# Patient Record
Sex: Female | Born: 1975 | Race: White | Hispanic: No | Marital: Married | Smoking: Former smoker
Health system: Southern US, Community
[De-identification: ages and names within clinical notes are randomized; demographics above are authoritative.]

## PROBLEM LIST (undated history)

## (undated) DIAGNOSIS — G8929 Other chronic pain: Secondary | ICD-10-CM

## (undated) DIAGNOSIS — R51 Headache: Secondary | ICD-10-CM

## (undated) DIAGNOSIS — G35 Multiple sclerosis: Secondary | ICD-10-CM

## (undated) DIAGNOSIS — R519 Headache, unspecified: Secondary | ICD-10-CM

## (undated) DIAGNOSIS — R569 Unspecified convulsions: Secondary | ICD-10-CM

## (undated) HISTORY — PX: COSMETIC SURGERY: SHX468

## (undated) HISTORY — DX: Headache, unspecified: R51.9

## (undated) HISTORY — DX: Headache: R51

## (undated) HISTORY — DX: Other chronic pain: G89.29

## (undated) HISTORY — DX: Unspecified convulsions: R56.9

## (undated) HISTORY — DX: Multiple sclerosis: G35

## (undated) HISTORY — PX: ABDOMINAL SURGERY: SHX537

---

## 2014-06-14 DIAGNOSIS — E538 Deficiency of other specified B group vitamins: Secondary | ICD-10-CM | POA: Insufficient documentation

## 2014-06-14 DIAGNOSIS — G35 Multiple sclerosis: Secondary | ICD-10-CM | POA: Insufficient documentation

## 2014-06-14 DIAGNOSIS — M545 Low back pain, unspecified: Secondary | ICD-10-CM | POA: Insufficient documentation

## 2014-06-14 DIAGNOSIS — G40219 Localization-related (focal) (partial) symptomatic epilepsy and epileptic syndromes with complex partial seizures, intractable, without status epilepticus: Secondary | ICD-10-CM | POA: Insufficient documentation

## 2014-07-15 DIAGNOSIS — R0600 Dyspnea, unspecified: Secondary | ICD-10-CM | POA: Insufficient documentation

## 2014-08-05 ENCOUNTER — Encounter: Payer: Self-pay | Admitting: Neurology

## 2014-08-05 ENCOUNTER — Encounter: Payer: Self-pay | Admitting: *Deleted

## 2014-08-05 ENCOUNTER — Ambulatory Visit (INDEPENDENT_AMBULATORY_CARE_PROVIDER_SITE_OTHER): Payer: 59 | Admitting: Neurology

## 2014-08-05 VITALS — BP 103/71 | HR 91 | Ht 61.5 in | Wt 125.4 lb

## 2014-08-05 DIAGNOSIS — G40209 Localization-related (focal) (partial) symptomatic epilepsy and epileptic syndromes with complex partial seizures, not intractable, without status epilepticus: Secondary | ICD-10-CM | POA: Diagnosis not present

## 2014-08-05 DIAGNOSIS — M5440 Lumbago with sciatica, unspecified side: Secondary | ICD-10-CM

## 2014-08-05 DIAGNOSIS — H532 Diplopia: Secondary | ICD-10-CM

## 2014-08-05 DIAGNOSIS — G35 Multiple sclerosis: Secondary | ICD-10-CM | POA: Diagnosis not present

## 2014-08-05 DIAGNOSIS — R5383 Other fatigue: Secondary | ICD-10-CM

## 2014-08-05 DIAGNOSIS — R260 Ataxic gait: Secondary | ICD-10-CM | POA: Diagnosis not present

## 2014-08-05 DIAGNOSIS — R4189 Other symptoms and signs involving cognitive functions and awareness: Secondary | ICD-10-CM

## 2014-08-05 DIAGNOSIS — E538 Deficiency of other specified B group vitamins: Secondary | ICD-10-CM | POA: Diagnosis not present

## 2014-08-05 MED ORDER — ACETAMINOPHEN-CODEINE #3 300-30 MG PO TABS: 1.0000 | ORAL_TABLET | Freq: Three times a day (TID) | ORAL | Status: DC | PRN

## 2014-08-05 MED ORDER — METHYLPREDNISOLONE 4 MG PO TBPK: ORAL_TABLET | ORAL | Status: DC

## 2014-08-05 NOTE — Patient Instructions (Addendum)
Pt was instructed that she would receive a shot of Vit B12 that was provided by her from her pharmacy. Dr. Epimenio Foot asked me to give this pt a shot of of vit b12 IM injection while she was in the office. I gave the pt a shot with a 25G 1 inch needle into the left deltoid. She provided the medication from home. Colen Darling RN witnessed the injection

## 2014-08-05 NOTE — Progress Notes (Signed)
[facility]  PATIENT: Paula Anderson DOB: Oct 19, 1975  REFERRING DOCTOR OR PCP:  Delbert Harness    Fax [phone] SOURCE: records from Dr. Earnest Bailey, patient, MRI images on CD  She has seen 2 neurologists in [place] before she moved to this area. They are Dr. Salvadore Oxford [phone]) and Georgie Chard.    _________________________________   HISTORICAL  CHIEF COMPLAINT:  Chief Complaint  Patient presents with  . New Evaluation    newly diagnosed with Multiple Sclerosis; pt is in room 5    HISTORY OF PRESENT ILLNESS:  I had the pleasure seeing you patient, Paula Anderson, [facility] for neurologic consultation regarding her multiple sclerosis and seizures. She is a 39 year old woman who presented with difficulties with speech, memory and gait balance in 2013. MRIs were consistent with MS.   In 2014, she had a severe episode of vertigo and was taken to the hospital. Repeat MRI imaging was performed and she was diagnosed officially with MS. She was started on Copaxone. She has episodes where she has more fatigue and pain and has been treated with steroids in the past for these.  Seizures: Around the middle 2014, she began to have spells where she would lose her memory.   Some of the spells occurred while driving and one spell occurred while she was on the stairs and she just remembers waking up at the bottom. She taken to the emergency room for that spell and she had shaking but was conscious during the shaking. Another spell she recalls bending over and then just being awake in the bottom of the bathtub. She is currently on lamotrigine 300 mg twice a day and Vimpat 100 mg by mouth twice a day. She had an EEG in [place] that reportedly had spikes but we do not have those results at this time.  Gait/strength/sensation: She has difficulty with her gait, especially with balance. There also is is sometimes milder weakness in the legs. He has tingling in her legs but no  more pronounced numbness.     Bladder/bowel:  She denies any bladder or bowel issues.  Vision/Vertigo:  In 2013, she lost some vision out of the left eye. She does not note any color asymmetry.  She gets double vision when she looks to the left and her left eye hurts.   She notes vertigo, that may occur at rest but is more likely to occur when she moves. This has been worse over the past several weeks.  Fatigue/sleep:  She has had difficulties with fatigue, physical more than mental. She also has had difficulties with insomnia. She was placed on Ambien. The initial dose was too high and it was reduced. She takes it only occasionally. She also was prescribed a medication, possibly Provigil, to help her stay awake more during the daytime.  Mood/cognition:   She has not noted actual depression but has been irritable at times. This can sometimes come, "out of the blue". She notes some anxiety when there are many people around her, especially when they are to her left since she can't see as well in that direction.  She has noted difficulty with cognition, especially decreased short-term memory, decreased ability to come up with the right word and decreased focus.   B12 deficiency:  She was found to have a B12 deficiency. Initially she was placed on liquids or pills but B12 remained low so shots were prescribed. However, she is unable to give herself shots.  She also reports lower back  pain that is worse now than usual.  I personally reviewed MRI images of the brain dated 06/11/2012.  In the posterior fossa there is a right middle cerebellar peduncle lesion. There are multiple periventricular, deep and juxtacortical white matter foci in the hemispheres. Some of these are oriented to the ventricles. Some are hypointense on T1-weighted images. She also had MRIs from 01/18/2013 on a CD that I was unable to open up.  REVIEW OF SYSTEMS: Constitutional: No fevers, chills, sweats, or change in appetite.   She  reports fatigue and insomnia. Eyes: as above Ear, nose and throat: No hearing loss, ear pain, nasal congestion, sore throat.   She has vertigo. Cardiovascular: No chest pain, palpitations Respiratory: No shortness of breath at rest or with exertion.   No wheezes GastrointestinaI: No nausea, vomiting, diarrhea, abdominal pain, fecal incontinence Genitourinary: No dysuria, urinary retention or frequency.  No nocturia. Musculoskeletal: No neck pain.  She  notesback pain Integumentary: No rash, pruritus, skin lesions Neurological: as above Psychiatric: No depression at this time.  No anxiety Endocrine: No palpitations, diaphoresis, change in appetite, change in weigh or increased thirst Hematologic/Lymphatic: No anemia, purpura, petechiae.   She feels that she bruises easily. Allergic/Immunologic: No itchy/runny eyes, nasal congestion, recent allergic reactions, rashes  ALLERGIES: Allergies  Allergen Reactions  . Amoxicillin Shortness Of Breath  . Penicillins Shortness Of Breath    Skin turns red    HOME MEDICATIONS:  Current outpatient prescriptions:  .  acetaminophen-codeine (TYLENOL #3) 300-30 MG per tablet, Take 1 tablet by mouth every 8 (eight) hours as needed for moderate pain., Disp: 45 tablet, Rfl: 1 .  cholecalciferol (VITAMIN D) 1000 UNITS tablet, Take 1,000 Units by mouth daily., Disp: , Rfl:  .  COPAXONE 40 MG/ML SOSY, , Disp: , Rfl:  .  diclofenac sodium (VOLTAREN) 1 % GEL, Apply topically., Disp: , Rfl:  .  Fluticasone Furoate-Vilanterol (BREO ELLIPTA) 100-25 MCG/INH AEPB, Inhale into the lungs., Disp: , Rfl:  .  lamoTRIgine (LAMICTAL) 100 MG tablet, Take 300 mg by mouth 2 (two) times daily. , Disp: , Rfl: 11 .  methylPREDNISolone (MEDROL DOSEPAK) 4 MG TBPK tablet, Take as directed, Disp: 21 tablet, Rfl: 0 .  VIMPAT 50 MG TABS tablet, Take 100 mg by mouth 2 (two) times daily. , Disp: , Rfl: 5 .  VITAMIN B1-B12 IM, Inject 1,000 mcg into the muscle every 30 (thirty)  days., Disp: , Rfl:   PAST MEDICAL HISTORY: Past Medical History  Diagnosis Date  . Multiple sclerosis   . Chronic headaches   . Seizures     PAST SURGICAL HISTORY: Past Surgical History  Procedure Laterality Date  . Cosmetic surgery    . Abdominal surgery      FAMILY HISTORY: Family History  Problem Relation Age of Onset  . Cancer Father     SOCIAL HISTORY:  History   Social History  . Marital Status: Married    Spouse Name: Richard  . Number of Children: 2  . Years of Education: 12   Occupational History  . unemployeed    Social History Main Topics  . Smoking status: Former Games developer  . Smokeless tobacco: Not on file     Comment: quit 2010  . Alcohol Use: No  . Drug Use: No  . Sexual Activity:    Partners: Male   Other Topics Concern  . Not on file   Social History Narrative   Lives at home with husband and children   Drinks caffeine:  2 cups a day     PHYSICAL EXAM  Filed Vitals:   08/05/14 0937  BP: 103/71  Pulse: 91  Height: 5' 1.5" (1.562 m)  Weight: 125 lb 6.4 oz (56.881 kg)    Body mass index is 23.31 kg/(m^2).   General: The patient is well-developed and well-nourished and in no acute distress  Eyes:  Funduscopic exam shows normal optic discs and retinal vessels.  No cataracts.  VA:   OD 20/50   OS 20/100  Neck: The neck is supple, no carotid bruits are noted.  The neck is nontender.  Cardiovascular: The heart has a regular rate and rhythm with a normal S1 and S2. There were no murmurs, gallops or rubs. Lungs are clear to auscultation.  Skin: Extremities are without significant edema.  Musculoskeletal:  Back is mildly tender  Neurologic Exam  Mental status: The patient is alert and oriented x 3 at the time of the examination. The patient has apparent normal recent and remote memory, with an apparently normal attention span and concentration ability.   Speech is normal.  Cranial nerves: Extraocular movements show left nystagmus on  left gaze. Pupils are equal, round, and reactive to light and accomodation.  Visual fields are full.  Facial symmetry is present. There is good facial sensation to soft touch bilaterally.Facial strength is normal.  Trapezius and sternocleidomastoid strength is normal. No dysarthria is noted.  The tongue is midline, and the patient has symmetric elevation of the soft palate. No obvious hearing deficits are noted.  Motor:  Muscle bulk is normal.   Tone is normal. Strength is  5 / 5 in all 4 extremities.   Sensory: Sensory testing is intact to pinprick, soft touch and vibration sensation in all 4 extremities.  Coordination: Cerebellar testing reveals good finger-nose-finger and heel-to-shin bilaterally.  Gait and station: Station is normal.   Gait is mildly wide. Tandem gait is wide. Romberg is negative.   Reflexes: Deep tendon reflexes are symmetric and increased (spread at knees) bilaterally.   Plantar responses are flexor.    DIAGNOSTIC DATA (LABS, IMAGING, TESTING) - I reviewed patient records, labs, notes, testing and imaging myself where available.     ASSESSMENT AND PLAN  DS (disseminated sclerosis) - Plan: Hepatic function panel, TSH, MR Brain W Wo Contrast, MR Cervical Spine W Wo Contrast, CBC with Differential, CANCELED: CBC with Differential/Platelet  Localization-related focal epilepsy with complex partial seizures  B12 deficiency - Plan: Vitamin B12  Midline low back pain with sciatica, sciatica laterality unspecified  Ataxic gait - Plan: Vitamin B12, MR Brain W Wo Contrast, MR Cervical Spine W Wo Contrast  Diplopia - Plan: TSH, MR Brain W Wo Contrast  Other fatigue - Plan: Vitamin B12, TSH, CBC with Differential  Disturbed cognition    In summary, Jermiah Howton is a 39 year old woman who was diagnosed with  Relapsing remitting MS in 2013 and began Copaxone therapy in 2014. Her main issues are poor gait, left visual disturbance, fatigue and disturbed cognition. She  also has had spells that have been diagnosed as epilepsy and she is currently on lamotrigine plus Vimpat.  I will check an MRI of the brain with and without contrast and MRI and the cervical spine to determine if there has been subclinical progression as she has had more symptoms over the last year. The MRI of the spine can also rule out non-MS related issues that might be contributing to her gait disturbance.    We will get records from  her 2 prior neurologist. She brought her vitamin B12 but does not like giving herself shots so 1 cc was injected while she was here.     We'll check blood work. She also reports that her low back pain is worse.  Tylenol No. 3 was prescribed.   She will return to see me in 3 months or sooner if she has new or worsening neurologic symptoms or based on the findings of the MRI studies.  Richard A. Epimenio Foot, MD, PhD 08/05/2014, 10:34 AM Certified in Neurology, Clinical Neurophysiology, Sleep Medicine, Pain Medicine and Neuroimaging  [facility] [address], [address] [place], [place] [address] [phone]

## 2014-08-06 LAB — TSH: TSH: 0.894 u[IU]/mL (ref 0.450–4.500)

## 2014-08-06 LAB — HEPATIC FUNCTION PANEL
ALK PHOS: 77 IU/L (ref 39–117)
ALT: 10 IU/L (ref 0–32)
AST: 16 IU/L (ref 0–40)
Albumin: 4.7 g/dL (ref 3.5–5.5)
BILIRUBIN, DIRECT: 0.1 mg/dL (ref 0.00–0.40)
Bilirubin Total: 0.4 mg/dL (ref 0.0–1.2)
Total Protein: 7.1 g/dL (ref 6.0–8.5)

## 2014-08-06 LAB — CBC WITH DIFFERENTIAL/PLATELET
Basophils Absolute: 0 10*3/uL (ref 0.0–0.2)
Basos: 1 %
EOS (ABSOLUTE): 0.2 10*3/uL (ref 0.0–0.4)
EOS: 3 %
Hematocrit: 43.3 % (ref 34.0–46.6)
Hemoglobin: 14.2 g/dL (ref 11.1–15.9)
Immature Grans (Abs): 0 10*3/uL (ref 0.0–0.1)
Immature Granulocytes: 0 %
Lymphocytes Absolute: 1.9 10*3/uL (ref 0.7–3.1)
Lymphs: 31 %
MCH: 30.6 pg (ref 26.6–33.0)
MCHC: 32.8 g/dL (ref 31.5–35.7)
MCV: 93 fL (ref 79–97)
Monocytes Absolute: 0.4 10*3/uL (ref 0.1–0.9)
Monocytes: 7 %
NEUTROS PCT: 58 %
Neutrophils Absolute: 3.6 10*3/uL (ref 1.4–7.0)
PLATELETS: 252 10*3/uL (ref 150–379)
RBC: 4.64 x10E6/uL (ref 3.77–5.28)
RDW: 12.8 % (ref 12.3–15.4)
WBC: 6.1 10*3/uL (ref 3.4–10.8)

## 2014-08-06 LAB — VITAMIN B12: Vitamin B-12: >2000 pg/mL — ABNORMAL HIGH (ref 211–946)

## 2014-08-08 ENCOUNTER — Telehealth: Payer: Self-pay | Admitting: *Deleted

## 2014-08-08 NOTE — Telephone Encounter (Signed)
LMTC./fim 

## 2014-08-08 NOTE — Telephone Encounter (Signed)
I have spoken with Paula Anderson and per RAS, have advised labs are ok--she can stop vitamin b12 inj. as vit. b12 level was high.  [name] verbalized understanding of same/fim

## 2014-08-08 NOTE — Telephone Encounter (Signed)
Patient returned call. Please call and advise.  °

## 2014-08-08 NOTE — Telephone Encounter (Signed)
-----   Message from Richard A Sater, MD sent at 08/08/2014 12:15 PM EDT ----- Labs were normal 

## 2014-08-24 ENCOUNTER — Telehealth: Payer: Self-pay | Admitting: *Deleted

## 2014-08-24 NOTE — Telephone Encounter (Signed)
LMTC./fim 

## 2014-08-24 NOTE — Telephone Encounter (Signed)
-----   Message from Asa Lente, MD sent at 08/08/2014 12:15 PM EDT ----- Labs were normal

## 2014-08-25 ENCOUNTER — Encounter: Payer: Self-pay | Admitting: *Deleted

## 2014-08-25 NOTE — Telephone Encounter (Signed)
-----   Message from Richard A Sater, MD sent at 08/08/2014 12:15 PM EDT ----- Labs were normal 

## 2014-08-25 NOTE — Telephone Encounter (Signed)
Unable to contact letter mailed to home address, advising Lavinia that recent labwork done in our office was normal; call with questions/fim

## 2014-09-14 ENCOUNTER — Telehealth: Payer: Self-pay | Admitting: Neurology

## 2014-09-14 NOTE — Telephone Encounter (Signed)
Patient states he has been doing well until 3 days ago.She feels her bones are aching. She has been taking aleve and advil but is questioning how many at a time she can take. She is trying to get some kind of relief. Please call and advise. Patient can be reached at [phone].

## 2014-09-15 MED ORDER — METHYLPREDNISOLONE 4 MG PO TBPK: ORAL_TABLET | ORAL | Status: DC

## 2014-09-15 MED ORDER — MELOXICAM 15 MG PO TABS: 15.0000 mg | ORAL_TABLET | Freq: Every day | ORAL | Status: DC

## 2014-09-15 NOTE — Telephone Encounter (Signed)
I spoke with [name] yesterday--she c/o generalized "achiness".  Sts. in the past oral steroids have helped. Per RAS, ok for Medrol dose pk, and for Meloxicam 15mg , one po qd #30.  I lmom for Angellena letting her know I have escribed these rx's to her pharmacy--she does not have to return this call unless she has questions/fim

## 2014-09-26 ENCOUNTER — Telehealth: Payer: Self-pay | Admitting: Neurology

## 2014-09-26 NOTE — Telephone Encounter (Signed)
Patient is requesting a RX for a ondansetron ODT  for nausea with vertigo. She got this from ED in [place]. P Please call and advise.Please call to [facility] on [address], [address].

## 2014-09-27 MED ORDER — ONDANSETRON HCL 8 MG PO TABS: 8.0000 mg | ORAL_TABLET | Freq: Three times a day (TID) | ORAL | Status: DC | PRN

## 2014-09-27 NOTE — Telephone Encounter (Signed)
I have spoken with Shany this morning and per RAS, have advised that rx. for Zofran 8mg  #12 with 3 r/f has been sent to [facility] per her request.  She verbalized understanding of same/fim

## 2014-09-27 NOTE — Telephone Encounter (Signed)
Ok to send in #12 with 3 refills

## 2014-10-19 ENCOUNTER — Ambulatory Visit (INDEPENDENT_AMBULATORY_CARE_PROVIDER_SITE_OTHER): Payer: 59

## 2014-10-19 DIAGNOSIS — H532 Diplopia: Secondary | ICD-10-CM

## 2014-10-19 DIAGNOSIS — R260 Ataxic gait: Secondary | ICD-10-CM

## 2014-10-19 DIAGNOSIS — G35 Multiple sclerosis: Secondary | ICD-10-CM | POA: Diagnosis not present

## 2014-10-19 MED ORDER — GADOPENTETATE DIMEGLUMINE 469.01 MG/ML IV SOLN: 12.0000 mL | Freq: Once | INTRAVENOUS | Status: DC | PRN

## 2014-10-25 ENCOUNTER — Telehealth: Payer: Self-pay | Admitting: *Deleted

## 2014-10-25 NOTE — Telephone Encounter (Signed)
I have spoken with Paula Anderson this afternoon and per RAS, advised that mri brain did not show any brand new lesions.  She sts. she had an mri in Aug. 2015 in [place]. [place], [place].  She will ask for cd of mri to be mailed to Dr. Bonnita Hollow attn here at [facility]/fim

## 2014-10-25 NOTE — Telephone Encounter (Signed)
-----   Message from Asa Lente, MD sent at 10/22/2014  3:03 PM EDT ----- Please let Paula Anderson know that the MRI of the brain does not show any brand-new lesions. She had an MRI in 2014. If she knows where that was performed we will try to get the old MRI for comparison. The MRI of the cervical spine was normal.

## 2014-10-26 ENCOUNTER — Telehealth: Payer: Self-pay | Admitting: Neurology

## 2014-10-26 NOTE — Telephone Encounter (Signed)
I have spoken with Paula Anderson this morning and advised that she should continue meds as rx'd until her 9-12 appt. with RAS.  She verbalized understanding of same/fim

## 2014-10-26 NOTE — Telephone Encounter (Signed)
Patient is calling to advise that she will get the cd of MRI mailed to Dr. Epimenio Foot. Patient also wants to discuss her seizure medication. She is presently taking lamoTRIgine (LAMICTAL) 100 MG tablet but has discontinued taking VIMPAT 50 MG TABS tablet because medication was not helping with seizures. Is there another seizure medication she can take to replace Vimpat. Please call to [facility] on the corner of [phone] and [address] in [place]. Thank you.

## 2014-10-31 ENCOUNTER — Ambulatory Visit (INDEPENDENT_AMBULATORY_CARE_PROVIDER_SITE_OTHER): Payer: 59 | Admitting: Neurology

## 2014-10-31 ENCOUNTER — Encounter: Payer: Self-pay | Admitting: Neurology

## 2014-10-31 VITALS — BP 106/54 | HR 88 | Resp 14 | Ht 61.5 in | Wt 125.0 lb

## 2014-10-31 DIAGNOSIS — G35 Multiple sclerosis: Secondary | ICD-10-CM | POA: Diagnosis not present

## 2014-10-31 DIAGNOSIS — R260 Ataxic gait: Secondary | ICD-10-CM | POA: Diagnosis not present

## 2014-10-31 DIAGNOSIS — R5383 Other fatigue: Secondary | ICD-10-CM

## 2014-10-31 DIAGNOSIS — R4189 Other symptoms and signs involving cognitive functions and awareness: Secondary | ICD-10-CM | POA: Diagnosis not present

## 2014-10-31 DIAGNOSIS — G40209 Localization-related (focal) (partial) symptomatic epilepsy and epileptic syndromes with complex partial seizures, not intractable, without status epilepticus: Secondary | ICD-10-CM | POA: Diagnosis not present

## 2014-10-31 MED ORDER — MODAFINIL 200 MG PO TABS: 200.0000 mg | ORAL_TABLET | Freq: Every day | ORAL | Status: DC

## 2014-10-31 MED ORDER — DOXEPIN HCL 10 MG PO CAPS: 10.0000 mg | ORAL_CAPSULE | Freq: Every day | ORAL | Status: DC

## 2014-10-31 MED ORDER — ZONISAMIDE 100 MG PO CAPS: 100.0000 mg | ORAL_CAPSULE | Freq: Every day | ORAL | Status: DC

## 2014-10-31 NOTE — Progress Notes (Signed)
Marland Kitchen   [facility]  PATIENT: Paula Anderson DOB: September 25, 1975  REFERRING DOCTOR OR PCP:  Delbert Harness    Fax [phone] SOURCE: records from Dr. Earnest Bailey, patient, MRI images on CD  She has seen 2 neurologists in [place] before she moved to this area. They are Dr. Salvadore Oxford [phone]) and Georgie Chard.    _________________________________   HISTORICAL  CHIEF COMPLAINT:  Chief Complaint  Patient presents with  . Multiple Sclerosis    Sts. she continues to tolerate Copaxone well.  Sts. onset 3 weeks ago of increased coughing and wonders if this is related to MS.  Sts. she stopped Vimpat about a month ago because she felt it wasn't working--she continues to have episodes of decreeased awareness.  Sts. last episode was last week./fim  . Seizures    HISTORY OF PRESENT ILLNESS:  Mackenna Anderson is a 39 yo woman with  multiple sclerosis and seizures.   MS:   She is a 39 year old woman who presented with difficulties with speech, memory and gait balance in 2013. MRIs were consistent with MS.   In 2014, she had a severe episode of vertigo and was taken to the hospital. Repeat MRI imaging was performed and she was diagnosed officially with MS. She was started on Copaxone.   She received IV steroids couple times when she had severe fatigue.  Seizures: Her last seizure was last week.  She has about 2-3 a month, some witnessed by husband.   She has had only one GTC seizure.   Other events are associated with loss of memory and staring with decreased responsiveness. These started in 2014.     Some of the spells occurred while driving and one spell occurred while she was on the stairs and she just remembers waking up at the bottom. She taken to the emergency room for that spell and she had shaking but was conscious during the shaking.   She is currently on lamotrigine 300 mg twice a day.   Addition of Vimpat had not helped.   She had an EEG in [place] that reportedly had spikes but we do not  have those results at this time.  Gait/strength/sensation: She has difficulty with her balance and has stumbling but very rare falls. There also is is mild weakness in the left leg. She has tingling in her legs but no more pronounced numbness.     Bladder/bowel:  She denies any bladder or bowel issues.  Vision/Vertigo:  In 2013, she lost some vision out of the left eye. She notes mild asymmetry with brightness but not colors.     She notes vertigo, that may occur at rest but is more likely to occur when she moves. This has been worse over the past several weeks.   They are associated with nausea and rare vomiting.   They occur mostly at night.  Fatigue/sleep:  She has had difficulties with fatigue, physical more than mental.   She has insomnia. She was placed on Ambien but it had a hangover effect the next day.    She also was prescribed a medication, possibly Provigil, to help her stay awake more during the daytime.  Mood/cognition:   She denies much depression or generalized anxiety.    She notes some anxiety when there are many people around her like at church, especially when they are to her left since she can't see as well in that direction.  She has noted difficulty with cognition, especially decreased short-term memory, decreased ability to  come up with the right word and decreased focus.   B12 deficiency:  She was found to have a B12 deficiency. Initially she was placed on liquids or pills but B12 remained low so shots were prescribed. However, she is unable to give herself shots.  I personally reviewed MRI images of the brain dated 06/11/2012.  In the posterior fossa there is a right middle cerebellar peduncle lesion. There are multiple periventricular, deep and juxtacortical white matter foci in the hemispheres. Some of these are oriented to the ventricles. Some are hypointense on T1-weighted images. She also had MRIs from 01/18/2013 on a CD that I was unable to open up.  REVIEW OF  SYSTEMS: Constitutional: No fevers, chills, sweats, or change in appetite.   She reports fatigue and insomnia. Eyes: as above Ear, nose and throat: No hearing loss, ear pain, nasal congestion, sore throat.   She has vertigo. Cardiovascular: No chest pain, palpitations Respiratory: No shortness of breath at rest or with exertion.   No wheezes GastrointestinaI: No nausea, vomiting, diarrhea, abdominal pain, fecal incontinence Genitourinary: No dysuria, urinary retention or frequency.  No nocturia. Musculoskeletal: No neck pain.  She  notesback pain Integumentary: No rash, pruritus, skin lesions Neurological: as above Psychiatric: No depression at this time.  No anxiety Endocrine: No palpitations, diaphoresis, change in appetite, change in weigh or increased thirst Hematologic/Lymphatic: No anemia, purpura, petechiae.   She feels that she bruises easily. Allergic/Immunologic: No itchy/runny eyes, nasal congestion, recent allergic reactions, rashes  ALLERGIES: Allergies  Allergen Reactions  . Amoxicillin Shortness Of Breath  . Penicillins Shortness Of Breath    Skin turns red    HOME MEDICATIONS:  Current outpatient prescriptions:  .  cholecalciferol (VITAMIN D) 1000 UNITS tablet, Take 1,000 Units by mouth daily., Disp: , Rfl:  .  COPAXONE 40 MG/ML SOSY, , Disp: , Rfl:  .  Fluticasone Furoate-Vilanterol (BREO ELLIPTA) 100-25 MCG/INH AEPB, Inhale into the lungs., Disp: , Rfl:  .  lamoTRIgine (LAMICTAL) 100 MG tablet, Take 300 mg by mouth 2 (two) times daily. , Disp: , Rfl: 11 .  acetaminophen-codeine (TYLENOL #3) 300-30 MG per tablet, Take 1 tablet by mouth every 8 (eight) hours as needed for moderate pain. (Patient not taking: Reported on 10/31/2014), Disp: 45 tablet, Rfl: 1 .  diclofenac sodium (VOLTAREN) 1 % GEL, Apply topically., Disp: , Rfl:  .  meloxicam (MOBIC) 15 MG tablet, Take 1 tablet (15 mg total) by mouth daily. (Patient not taking: Reported on 10/31/2014), Disp: 30 tablet,  Rfl: 0 .  methylPREDNISolone (MEDROL DOSEPAK) 4 MG TBPK tablet, Take as directed (Patient not taking: Reported on 10/31/2014), Disp: 21 tablet, Rfl: 0 .  ondansetron (ZOFRAN) 8 MG tablet, Take 1 tablet (8 mg total) by mouth every 8 (eight) hours as needed for nausea or vomiting. (Patient not taking: Reported on 10/31/2014), Disp: 12 tablet, Rfl: 3 .  VIMPAT 50 MG TABS tablet, Take 100 mg by mouth 2 (two) times daily. , Disp: , Rfl: 5 .  VITAMIN B1-B12 IM, Inject 1,000 mcg into the muscle every 30 (thirty) days., Disp: , Rfl:  No current facility-administered medications for this visit.  Facility-Administered Medications Ordered in Other Visits:  .  gadopentetate dimeglumine (MAGNEVIST) injection 12 mL, 12 mL, Intravenous, Once PRN, Asa Lente, MD  PAST MEDICAL HISTORY: Past Medical History  Diagnosis Date  . Multiple sclerosis   . Chronic headaches   . Seizures     PAST SURGICAL HISTORY: Past Surgical History  Procedure Laterality  Date  . Cosmetic surgery    . Abdominal surgery      FAMILY HISTORY: Family History  Problem Relation Age of Onset  . Cancer Father     SOCIAL HISTORY:  Social History   Social History  . Marital Status: Married    Spouse Name: Richard  . Number of Children: 2  . Years of Education: 12   Occupational History  . unemployeed    Social History Main Topics  . Smoking status: Former Games developer  . Smokeless tobacco: Not on file     Comment: quit 2010  . Alcohol Use: No  . Drug Use: No  . Sexual Activity:    Partners: Male   Other Topics Concern  . Not on file   Social History Narrative   Lives at home with husband and children   Drinks caffeine: 2 cups a day     PHYSICAL EXAM  Filed Vitals:   10/31/14 0956  BP: 106/54  Pulse: 88  Resp: 14  Height: 5' 1.5" (1.562 m)  Weight: 125 lb (56.7 kg)    Body mass index is 23.24 kg/(m^2).   General: The patient is well-developed and well-nourished and in no acute distress    Neurologic Exam  Mental status: The patient is alert and oriented x 3 at the time of the examination. The patient has apparent normal recent and remote memory, with an apparently normal attention span and concentration ability.   Speech is normal.  Cranial nerves: Extraocular movements show left nystagmus on left gaze.  Facial symmetry is present. There is good facial sensation to soft touch bilaterally.Facial strength is normal.  Trapezius and sternocleidomastoid strength is normal. No dysarthria is noted.  The tongue is midline, and the patient has symmetric elevation of the soft palate. No obvious hearing deficits are noted.  Motor:  Muscle bulk is normal.   Tone is normal. Strength is  5 / 5 in all 4 extremities.   Sensory: Sensory testing is intact to pinprick, soft touch and vibration sensation in all 4 extremities.  Coordination: Cerebellar testing reveals good finger-nose-finger and heel-to-shin bilaterally.  Gait and station: Station is normal.   Gait is mildly wide. Tandem gait is wide. Romberg is negative.   Reflexes: Deep tendon reflexes are symmetric and increased (spread at knees) bilaterally.      DIAGNOSTIC DATA (LABS, IMAGING, TESTING) - I reviewed patient records, labs, notes, testing and imaging myself where available.     ASSESSMENT AND PLAN  DS (disseminated sclerosis)  Other fatigue  Ataxic gait  Disturbed cognition  Localization-related focal epilepsy with complex partial seizures   1.   Add zonisamide for seizures 2.   Doxepin for insomnia at night.   She will try modafinil fro her sleepiness and fatigue 3.   She will return to see me in 4 months or sooner if she has new or worsening neurologic symptoms     Richard A. Epimenio Foot, MD, PhD 10/31/2014, 10:25 AM Certified in Neurology, Clinical Neurophysiology, Sleep Medicine, Pain Medicine and Neuroimaging  [facility] [address], [address] [place], [place] [address] [phone]

## 2014-11-11 ENCOUNTER — Telehealth: Payer: Self-pay | Admitting: Neurology

## 2014-11-11 NOTE — Telephone Encounter (Signed)
I have spoken with Tammy Sours at [facility].  He sts. Modafinil needs a pa.  I have requested pa form be faxed to our office.  I have spoken with Brunilda and advised that pa form will be faxed to our office, but that it will likely not be completed today, as our office is currently closed, but that I will complete pa form asap next week and fax it back to insurance.  She verbalized understanding of same/fim

## 2014-11-11 NOTE — Telephone Encounter (Signed)
Patient is calling to discuss a medication she is taking for anxiety but does not know the name of it. [facility] says they need pre authorization for this medication. Please call and discuss.

## 2014-11-15 NOTE — Telephone Encounter (Signed)
I have spoken with Paula Anderson and given update that I have not received pa form from [facility]/fim

## 2014-11-17 ENCOUNTER — Encounter: Payer: Self-pay | Admitting: *Deleted

## 2014-11-17 NOTE — Telephone Encounter (Signed)
I still have not received pa form from [facility].  I have spoken with the pharmacist again today and requested form be faxed to [phone].  I have spoken with Raena to advise I am still waiting on this/fim

## 2014-11-17 NOTE — Telephone Encounter (Signed)
I received pa form from [facility].  I have spoken with Precious at [facility], given pa info, and have been informed that Modafinil is approved thru 11-17-15.  Ref. # [id number].  LMOM for Larae that this has been completed.  she does not need to return this call unless she has further questions/fim

## 2014-11-17 NOTE — Telephone Encounter (Addendum)
Pt called and would like an update on the situation with [facility]. Please all and advise [phone]

## 2014-12-05 ENCOUNTER — Telehealth: Payer: Self-pay | Admitting: Neurology

## 2014-12-05 NOTE — Telephone Encounter (Signed)
I have spoken with Paula Anderson, and per RAS, advised that b/c vaccines can fail, she should not be around anyone with a communicable disease.  She verbalized understanding of same, sts. she is not utd with vaccines anyway, and will f/u with her pcp for this/fim

## 2014-12-05 NOTE — Telephone Encounter (Signed)
Pt called and is wondering if it is ok to be around someone , mother in law, who has been around the sister in law who actually has Pertusis. Pt is flying out this afternoon and with MS she is concerned, does she need to stay away from them? Please call and advise [phone].

## 2014-12-14 ENCOUNTER — Telehealth: Payer: Self-pay | Admitting: Neurology

## 2014-12-14 MED ORDER — AMPHETAMINE-DEXTROAMPHET ER 20 MG PO CP24: 20.0000 mg | ORAL_CAPSULE | Freq: Every day | ORAL | Status: DC

## 2014-12-14 NOTE — Telephone Encounter (Signed)
Patient called to advise modafinil (PROVIGIL) 200 MG tablet medication that was given to her to stay awake, is not working. Patient states, she has finished taking the whole bottle and is not working.

## 2014-12-14 NOTE — Telephone Encounter (Signed)
LMTC./fim 

## 2014-12-14 NOTE — Telephone Encounter (Signed)
I have spoken with Arnell again and per RAS, offered Adderall XR 20mg  po qd.  She is agreeable, is aware ins. may require a pa, but will not know this until she takes rx. to the pharmacy.  Rx. printed, signed, up front [facility]/fim

## 2014-12-14 NOTE — Telephone Encounter (Signed)
I have spoken with Paula Anderson this afternoon.  She reports Provigil is not helping at all with fatigue. Sts. she has never tried any other meds for fatigue/fim

## 2014-12-14 NOTE — Telephone Encounter (Signed)
Pt called returning Faith's call

## 2014-12-20 DIAGNOSIS — Z8541 Personal history of malignant neoplasm of cervix uteri: Secondary | ICD-10-CM | POA: Insufficient documentation

## 2014-12-20 MED ORDER — AMPHETAMINE-DEXTROAMPHET ER 30 MG PO CP24: 30.0000 mg | ORAL_CAPSULE | Freq: Every day | ORAL | Status: DC

## 2014-12-20 NOTE — Addendum Note (Signed)
Addended by: Candis Schatz I on: 12/20/2014 05:02 PM   Modules accepted: Orders

## 2014-12-20 NOTE — Telephone Encounter (Signed)
LMTC. Per RAS, ok to increase to Adderall XR  daily.  Rx. printed, signed, up front [facility]/fim

## 2014-12-20 NOTE — Telephone Encounter (Signed)
Pt called sts she has taken Adderall for a couple of days and it does seem to be working but after 3-4 hrs it wears off and she goes back to bed. She doesn't know if she is taking it too early in the morning. Please call and advise at [phone]

## 2014-12-21 NOTE — Telephone Encounter (Signed)
Patient is calling to discuss Rx amphetamine-dextroamphetamine (ADDERALL XR) 30 MG 24 hr capsule before picking up the Rx. Thank you.

## 2014-12-21 NOTE — Telephone Encounter (Signed)
I have spoken with Paula Anderson this am and per RAS, advised ok to increase to Adderall XR 30mg  daily to see if this gives longer lasting relief from fatigue.  She verbalized understanding of same, sts. will pick rx. up tomorrow./fim

## 2014-12-22 NOTE — Telephone Encounter (Signed)
Error

## 2015-02-06 ENCOUNTER — Telehealth: Payer: Self-pay | Admitting: Neurology

## 2015-02-06 MED ORDER — AMPHETAMINE-DEXTROAMPHET ER 30 MG PO CP24: 30.0000 mg | ORAL_CAPSULE | Freq: Every day | ORAL | Status: DC

## 2015-02-06 NOTE — Telephone Encounter (Signed)
Patient called to request renewal of amphetamine-dextroamphetamine (ADDERALL XR) 30 MG 24 hr capsule, prescription has expired. States she has 1st set of 20 mg and doesn't know if she could take 2 of those since she has been out since Friday.

## 2015-02-06 NOTE — Telephone Encounter (Signed)
Adderall rx. printed, signed, up front [facility].  I spoke with [name] and let her know rx. is available to be picked up/fim

## 2015-02-22 ENCOUNTER — Telehealth: Payer: Self-pay | Admitting: Neurology

## 2015-02-22 NOTE — Telephone Encounter (Signed)
Pt called and says she woke up yesterday morning and says she is having numbness in pelvic region. Says that during intercourse she does not have any feeling. She also says that her whole body itches. Please call and advise [phone].

## 2015-02-22 NOTE — Telephone Encounter (Signed)
I have spoken with Paula Anderson and per RAS, advised that numbness in the pelvic region alone is less likely to be related to MS.  She has no other c/o involving legs.  No trunkal numbness.  Per RAS, is sx. worsens or does not improve, he would do an mri thoracic spine with and without contrast.  [name] verbalized understanding of same--is aware to f/u with ob/gyn for investigation of pelvic numbness, but that she should also call me back if numbness persists or worsens, or if she develops other sx., and mri t-spine will be ordered/fim

## 2015-04-03 ENCOUNTER — Encounter: Payer: Self-pay | Admitting: Neurology

## 2015-04-03 ENCOUNTER — Ambulatory Visit (INDEPENDENT_AMBULATORY_CARE_PROVIDER_SITE_OTHER): Payer: 59 | Admitting: Neurology

## 2015-04-03 VITALS — BP 108/60 | HR 82 | Resp 16 | Ht 61.5 in | Wt 111.0 lb

## 2015-04-03 DIAGNOSIS — G40209 Localization-related (focal) (partial) symptomatic epilepsy and epileptic syndromes with complex partial seizures, not intractable, without status epilepticus: Secondary | ICD-10-CM

## 2015-04-03 DIAGNOSIS — R5383 Other fatigue: Secondary | ICD-10-CM

## 2015-04-03 DIAGNOSIS — R4189 Other symptoms and signs involving cognitive functions and awareness: Secondary | ICD-10-CM | POA: Diagnosis not present

## 2015-04-03 DIAGNOSIS — R260 Ataxic gait: Secondary | ICD-10-CM | POA: Diagnosis not present

## 2015-04-03 DIAGNOSIS — G35 Multiple sclerosis: Secondary | ICD-10-CM

## 2015-04-03 MED ORDER — LEVETIRACETAM 500 MG PO TABS: 500.0000 mg | ORAL_TABLET | Freq: Two times a day (BID) | ORAL | Status: DC

## 2015-04-03 MED ORDER — AMPHETAMINE-DEXTROAMPHET ER 30 MG PO CP24: 30.0000 mg | ORAL_CAPSULE | Freq: Every day | ORAL | Status: DC

## 2015-04-03 NOTE — Progress Notes (Signed)
Marland Kitchen   [facility]  PATIENT: Paula Anderson DOB: 05-30-1975  REFERRING DOCTOR OR PCP:  Delbert Harness    Fax [phone] SOURCE: records from Dr. Earnest Bailey, patient, MRI images on CD  She has seen 2 neurologists in [place] before she moved to this area. They are Dr. Salvadore Oxford [phone]) and Georgie Chard.    _________________________________   HISTORICAL  CHIEF COMPLAINT:  Chief Complaint  Patient presents with  . Multiple Sclerosis    Sts. she continues to tolerate Copaxone well.  Sts. she has had 2 new episodes of decreased awareness--Sts. she was in the grocery store with her husband and he told her she just had  funny look in her eyes, was puting items in the cart that she wouldn't normally buy.  Denies missed doses of Zonisamide or Lamictal.  Today she would like to discuss new sx. of decreased appetite--onset 2 weeks after starting Adderall.  Sts. food smells really bother her./fim    HISTORY OF PRESENT ILLNESS:  Paula Anderson is a 40 yo woman with  multiple sclerosis and seizures.   MS:   She is a 40 year old woman who presented with difficulties with speech, memory and gait balance in 2013. MRIs were consistent with MS.   In 2014, she had a severe episode of vertigo and was taken to the hospital. Repeat MRI imaging was performed and she was diagnosed officially with MS. She was started on Copaxone.   She received IV steroids couple times when she had severe fatigue.       Seizures: She has had a couple more spells since the last visit --- both the same day.    She gets a glazed over look and is briefly unresponsive (20-40 seconds).  She was having 2-3 a month before zonisamide was added but now is doing better.     She is also on lamotrigine 600 mg / day.  She has had only one GTC seizure.   Other events are associated with loss of memory and staring with decreased responsiveness.   Vimpat did not help.      She had an EEG in [place] that reportedly had spikes but we do  not have those results at this time.  Gait/strength/sensation: She has noted a few more falls.   She stumbles and may fall if she can't quickly grab on to something .   She notes mild L> R leg weakness. She has tingling in her legs but no more pronounced numbness.     Bladder/bowel:  She denies any bladder or bowel issues.  Vision/Vertigo:  In 2013, she lost some vision out of the left eye and did not recover much.. She notes mild asymmetry with brightness.     Vertigo is better.   She also had nausea, helped by Zofran.    Fatigue/sleep:  She has had difficulties with fatigue, physical more than mental.  Adderall has helped but has caused decreased appetite.    She has insomnia. She was placed on Ambien but it had a hangover effect the next day.    She also was prescribed a medication, possibly Provigil, to help her stay awake more during the daytime.  Mood/cognition:   She denies depression or generalized anxiety but feels stressed (buying a house).      She has noted difficulty with cognition, especially decreased short-term memory, decreased ability to come up with the right word and decreased focus.   I personally reviewed MRI images of the brain and spine.  In the posterior fossa there is a right middle cerebellar peduncle lesion, possible medulla focus and left midbrain focus and two small cerebellar hemisphere foci. There are multiple periventricular, deep and juxtacortical white matter foci in the hemispheres. Some of these are oriented to the ventricles. Some are hypointense on T1-weighted images. None enhance.  C- Spine without plaques.    REVIEW OF SYSTEMS: Constitutional: No fevers, chills, sweats, or change in appetite.   She reports fatigue and insomnia. Eyes: as above Ear, nose and throat: No hearing loss, ear pain, nasal congestion, sore throat.   She has vertigo. Cardiovascular: No chest pain, palpitations Respiratory: No shortness of breath at rest or with exertion.   No  wheezes GastrointestinaI: No nausea, vomiting, diarrhea, abdominal pain, fecal incontinence Genitourinary: No dysuria, urinary retention or frequency.  No nocturia. Musculoskeletal: No neck pain.  She  notesback pain Integumentary: No rash, pruritus, skin lesions Neurological: as above Psychiatric: No depression at this time.  No anxiety Endocrine: No palpitations, diaphoresis, change in appetite, change in weigh or increased thirst Hematologic/Lymphatic: No anemia, purpura, petechiae.   She feels that she bruises easily. Allergic/Immunologic: No itchy/runny eyes, nasal congestion, recent allergic reactions, rashes  ALLERGIES: Allergies  Allergen Reactions  . Amoxicillin Shortness Of Breath  . Penicillins Shortness Of Breath    Skin turns red    HOME MEDICATIONS:  Current outpatient prescriptions:  .  acetaminophen-codeine (TYLENOL #3) 300-30 MG per tablet, Take 1 tablet by mouth every 8 (eight) hours as needed for moderate pain. (Patient not taking: Reported on 10/31/2014), Disp: 45 tablet, Rfl: 1 .  amphetamine-dextroamphetamine (ADDERALL XR) 30 MG 24 hr capsule, Take 1 capsule (30 mg total) by mouth daily., Disp: 30 capsule, Rfl: 0 .  cholecalciferol (VITAMIN D) 1000 UNITS tablet, Take 1,000 Units by mouth daily., Disp: , Rfl:  .  COPAXONE 40 MG/ML SOSY, , Disp: , Rfl:  .  diclofenac sodium (VOLTAREN) 1 % GEL, Apply topically., Disp: , Rfl:  .  doxepin (SINEQUAN) 10 MG capsule, Take 1 capsule (10 mg total) by mouth at bedtime., Disp: 30 capsule, Rfl: 5 .  Fluticasone Furoate-Vilanterol (BREO ELLIPTA) 100-25 MCG/INH AEPB, Inhale into the lungs., Disp: , Rfl:  .  lamoTRIgine (LAMICTAL) 100 MG tablet, Take 300 mg by mouth 2 (two) times daily. , Disp: , Rfl: 11 .  meloxicam (MOBIC) 15 MG tablet, Take 1 tablet (15 mg total) by mouth daily. (Patient not taking: Reported on 10/31/2014), Disp: 30 tablet, Rfl: 0 .  methylPREDNISolone (MEDROL DOSEPAK) 4 MG TBPK tablet, Take as directed  (Patient not taking: Reported on 10/31/2014), Disp: 21 tablet, Rfl: 0 .  ondansetron (ZOFRAN) 8 MG tablet, Take 1 tablet (8 mg total) by mouth every 8 (eight) hours as needed for nausea or vomiting. (Patient not taking: Reported on 10/31/2014), Disp: 12 tablet, Rfl: 3 .  VITAMIN B1-B12 IM, Inject 1,000 mcg into the muscle every 30 (thirty) days., Disp: , Rfl:  .  zonisamide (ZONEGRAN) 100 MG capsule, Take 1 capsule (100 mg total) by mouth daily., Disp: 30 capsule, Rfl: 11 No current facility-administered medications for this visit.  Facility-Administered Medications Ordered in Other Visits:  .  gadopentetate dimeglumine (MAGNEVIST) injection 12 mL, 12 mL, Intravenous, Once PRN, Asa Lente, MD  PAST MEDICAL HISTORY: Past Medical History  Diagnosis Date  . Multiple sclerosis (HCC)   . Chronic headaches   . Seizures (HCC)     PAST SURGICAL HISTORY: Past Surgical History  Procedure Laterality Date  . Cosmetic  surgery    . Abdominal surgery      FAMILY HISTORY: Family History  Problem Relation Age of Onset  . Cancer Father     SOCIAL HISTORY:  Social History   Social History  . Marital Status: Married    Spouse Name: Richard  . Number of Children: 2  . Years of Education: 12   Occupational History  . unemployeed    Social History Main Topics  . Smoking status: Former Games developer  . Smokeless tobacco: Not on file     Comment: quit 2010  . Alcohol Use: No  . Drug Use: No  . Sexual Activity:    Partners: Male   Other Topics Concern  . Not on file   Social History Narrative   Lives at home with husband and children   Drinks caffeine: 2 cups a day     PHYSICAL EXAM  Filed Vitals:   04/03/15 1307  BP: 108/60  Pulse: 82  Resp: 16  Height: 5' 1.5" (1.562 m)  Weight: 111 lb (50.349 kg)    Body mass index is 20.64 kg/(m^2).   General: The patient is well-developed and well-nourished and in no acute distress   Neurologic Exam  Mental status: The patient is  alert and oriented x 3 at the time of the examination. The patient has apparent normal recent and remote memory, with an apparently normal attention span and concentration ability.   Speech is normal.  Cranial nerves: Extraocular movements show left nystagmus on left gaze.  Facial symmetry is present. There is good facial sensation to soft touch bilaterally.Facial strength is normal.  Trapezius and sternocleidomastoid strength is normal. No dysarthria is noted.  The tongue is midline, and the patient has symmetric elevation of the soft palate. No obvious hearing deficits are noted.  Motor:  Muscle bulk is normal.   Tone is normal. Strength is  5 / 5 in all 4 extremities.   Sensory: Sensory testing is intact to pinprick, soft touch and vibration sensation in all 4 extremities.  Coordination: Cerebellar testing reveals good finger-nose-finger and heel-to-shin bilaterally.  Gait and station: Station is normal.   Gait is mildly wide. Tandem gait is wide. Romberg is negative.   Reflexes: Deep tendon reflexes are symmetric and increased (spread at knees) bilaterally.      DIAGNOSTIC DATA (LABS, IMAGING, TESTING) - I reviewed patient records, labs, notes, testing and imaging myself where available.     ASSESSMENT AND PLAN  Multiple sclerosis (HCC)  Partial epilepsy with impairment of consciousness (HCC)  Ataxic gait  Other fatigue  Disturbed cognition   1.   Change zonisamide to Keppra for seizures as lens and appetite may be related to the zonisamide.  2.   continue Adderall for sleepiness and fatigue 3.    continue to stay active and exercises as tolerated.  4.   She will return to see me in 4 months or sooner if she has new or worsening neurologic symptoms     Richard A. Epimenio Foot, MD, PhD 04/03/2015, 1:30 PM Certified in Neurology, Clinical Neurophysiology, Sleep Medicine, Pain Medicine and Neuroimaging  [facility] [address], [address] [place], [place]  [address] [phone] f r and

## 2015-04-14 ENCOUNTER — Telehealth: Payer: Self-pay | Admitting: Neurology

## 2015-04-14 DIAGNOSIS — G35 Multiple sclerosis: Secondary | ICD-10-CM

## 2015-04-14 DIAGNOSIS — R260 Ataxic gait: Secondary | ICD-10-CM

## 2015-04-14 NOTE — Telephone Encounter (Signed)
Pt called said last OV Dr Epimenio Foot mentioned she should use a cane for balance. Pt sts she fell 2 x yesterday and need a RX for can.

## 2015-04-14 NOTE — Telephone Encounter (Signed)
I have spoken with Paula Anderson and per her request, dme order for can mailed to her home address/fim

## 2015-04-27 ENCOUNTER — Telehealth: Payer: Self-pay | Admitting: Neurology

## 2015-04-27 NOTE — Telephone Encounter (Signed)
[employer] - they are requesting the DME code for cane. They were only provided 4 #'s and it is a 5 digit code. May call customer service at [phone]. Please let the [facility] advocate know you want the conversation time stamped so it is documented for the pt.

## 2015-04-27 NOTE — Telephone Encounter (Signed)
[place]/[employer] called back to advise, call back number given is not a good number.

## 2015-04-27 NOTE — Telephone Encounter (Signed)
[facility] [phone] called regarding DME for cane, needs additional information, what type of equipment, needs code.

## 2015-05-08 ENCOUNTER — Telehealth: Payer: Self-pay | Admitting: Neurology

## 2015-05-08 MED ORDER — AMPHETAMINE-DEXTROAMPHET ER 30 MG PO CP24: 30.0000 mg | ORAL_CAPSULE | Freq: Every day | ORAL | Status: DC

## 2015-05-08 NOTE — Telephone Encounter (Signed)
Patient called to request refill of amphetamine-dextroamphetamine (ADDERALL XR) 30 MG 24 hr capsule °

## 2015-05-08 NOTE — Telephone Encounter (Signed)
Adderall rx. up front [facility]/fim 

## 2015-05-08 NOTE — Telephone Encounter (Signed)
Rx. awaiting RAS sig/fim 

## 2015-05-23 ENCOUNTER — Telehealth: Payer: Self-pay | Admitting: Neurology

## 2015-05-23 NOTE — Telephone Encounter (Signed)
LMOM that we will send Copaxone rx. in.  She does not need to return this call unless she has questions/fim

## 2015-05-23 NOTE — Telephone Encounter (Signed)
Patient is calling and states her PCP has asked if Dr. Epimenio Foot can sent a script for Rx copaxone 40 MG/ML SOSY to [facility] [phone]. She says that Dr. Epimenio Foot has offered to order for her before. Thanks!

## 2015-05-24 DIAGNOSIS — R37 Sexual dysfunction, unspecified: Secondary | ICD-10-CM | POA: Insufficient documentation

## 2015-05-24 MED ORDER — COPAXONE 40 MG/ML ~~LOC~~ SOSY: 40.0000 mg | PREFILLED_SYRINGE | SUBCUTANEOUS | Status: DC

## 2015-05-24 NOTE — Telephone Encounter (Signed)
Copacone rx. esccribed to [facility]/fim

## 2015-06-12 ENCOUNTER — Telehealth: Payer: Self-pay | Admitting: Neurology

## 2015-06-12 MED ORDER — AMPHETAMINE-DEXTROAMPHET ER 30 MG PO CP24: 30.0000 mg | ORAL_CAPSULE | Freq: Every day | ORAL | Status: DC

## 2015-06-12 NOTE — Telephone Encounter (Signed)
Rx. awaiting RAS sig/fim 

## 2015-06-12 NOTE — Telephone Encounter (Signed)
Adderall rx. up front [facility]/fim 

## 2015-06-12 NOTE — Telephone Encounter (Signed)
Pt called request refill for amphetamine-dextroamphetamine (ADDERALL XR) 30 MG 24 hr capsule .

## 2015-06-19 ENCOUNTER — Telehealth: Payer: Self-pay | Admitting: Neurology

## 2015-06-19 NOTE — Telephone Encounter (Signed)
I have spoken with Paula Anderson this afternoon--she c/o increased weakness in legs, more trouble with word finding.  Appt. to see RAS given for tomorrow 1400/fim

## 2015-06-19 NOTE — Telephone Encounter (Signed)
Patient called to advise, she has been doing really good with no seizures, has had headache since Friday morning 4/28, has no vision when she looks left, has had this before in the past but this time it's worse. Legs keep going out on her. Sometimes speaking the wrong word comes out or she can't think of word to say, states this has been going on a long time. Denies facial numbness.

## 2015-06-20 ENCOUNTER — Ambulatory Visit (INDEPENDENT_AMBULATORY_CARE_PROVIDER_SITE_OTHER): Payer: 59 | Admitting: Neurology

## 2015-06-20 ENCOUNTER — Encounter: Payer: Self-pay | Admitting: Neurology

## 2015-06-20 VITALS — BP 108/56 | HR 94 | Resp 14 | Ht 75.0 in | Wt 108.0 lb

## 2015-06-20 DIAGNOSIS — G4489 Other headache syndrome: Secondary | ICD-10-CM | POA: Diagnosis not present

## 2015-06-20 DIAGNOSIS — M542 Cervicalgia: Secondary | ICD-10-CM | POA: Insufficient documentation

## 2015-06-20 DIAGNOSIS — R5383 Other fatigue: Secondary | ICD-10-CM

## 2015-06-20 DIAGNOSIS — G40209 Localization-related (focal) (partial) symptomatic epilepsy and epileptic syndromes with complex partial seizures, not intractable, without status epilepticus: Secondary | ICD-10-CM | POA: Diagnosis not present

## 2015-06-20 DIAGNOSIS — R4189 Other symptoms and signs involving cognitive functions and awareness: Secondary | ICD-10-CM

## 2015-06-20 DIAGNOSIS — G35 Multiple sclerosis: Secondary | ICD-10-CM

## 2015-06-20 DIAGNOSIS — G35D Multiple sclerosis, unspecified: Secondary | ICD-10-CM

## 2015-06-20 DIAGNOSIS — R260 Ataxic gait: Secondary | ICD-10-CM

## 2015-06-20 DIAGNOSIS — H532 Diplopia: Secondary | ICD-10-CM

## 2015-06-20 MED ORDER — AMPHETAMINE-DEXTROAMPHET ER 30 MG PO CP24: 30.0000 mg | ORAL_CAPSULE | Freq: Every day | ORAL | Status: DC

## 2015-06-20 MED ORDER — PREDNISONE 50 MG PO TABS: ORAL_TABLET | ORAL | Status: DC

## 2015-06-20 NOTE — Progress Notes (Addendum)
Marland Kitchen   [facility]  PATIENT: Paula Anderson DOB: 05-Mar-1975  REFERRING DOCTOR OR PCP:  Paula Anderson    Fax [phone] SOURCE: records from Dr. Earnest Anderson, patient, MRI images on CD  She has seen 2 neurologists in [place] before she moved to this area. They are Dr. Salvadore Anderson [phone]) and Paula Anderson.    _________________________________   HISTORICAL  CHIEF COMPLAINT:  Chief Complaint  Patient presents with  . Multiple Sclerosis    Sts. she continues to tolerate Copaxone well.  She c/o more weakness in legs, right worse than left, and more trouble with word finding over the last 5 days/fim    HISTORY OF PRESENT ILLNESS:  Paula Anderson is a 40 yo woman with  multiple sclerosis and seizures. Since her last visit, she reports difficulties with vision on the left medulla headache on the right. She also feels balance is worse.  MS:   She presented with difficulties with speech, memory and gait balance in 2013. MRIs were consistent with MS.   In 2014, she had a severe episode of vertigo and was taken to the hospital. Repeat MRI imaging was performed and she was diagnosed officially with MS. She was started on Copaxone.   She received IV steroids couple times when she had severe fatigue.     She tolerates Copaxone well.    No difficulty with skin reactions.    Vision:    She has worse vision problems on the left with a foggy vision and reduced color saturation.     In 2013, she lost some vision out of the left eye. This seems different to her She notes mild asymmetry with brightness.     Vertigo is mild.   She also had nausea, helped by Zofran.    HA:   For the past week, she has had a headache on the right side that appears to radiate from the occiput forward. She has not noted much pain in the neck itself. She has nausea and also has had photophobia and phonophobia. Movements did not change the pain much.  Seizures: She has had no seizures since zonisamide switched to Keppra  and she tolerates it well.  She remains on lamotrigine.       Last one was 3 months ago with transient LOC.    She gets a glazed over look and is briefly unresponsive (20-40 seconds).     Vimpat did not help.      She had an EEG in [place] that reportedly had spikes but we do not have those results at this time.  Gait/strength/sensation: She feels off balanced, worse the past week.   Her legs were giving out a lot.   She stumbles and has needed to grab on to something for balance .   She notes mild leg weakness, right leg sometimes shakes. She has tingling in her legs but no more pronounced numbness.     Bladder/bowel:  She notes needing to urinate x 10 most days and x 1 most nights.   This is more than last year.        Fatigue/sleep:  She has had difficulties with fatigue, physical more than mental.  Adderall has helped but has caused decreased appetite.    She has insomnia. She was placed on Ambien but it had a hangover effect the next day.    She also was prescribed a medication, possibly Provigil, to help her stay awake more during the daytime.  Mood/cognition:   She  denies depression or generalized anxiety but feels stressed (buying a house).      She has noted difficulty with cognition, especially decreased short-term memory, decreased ability to come up with the right word and decreased focus.   I personally reviewed MRI images of the brain and spine.   In the posterior fossa there is a right middle cerebellar peduncle lesion, possible medulla focus and left midbrain focus and two small cerebellar hemisphere foci. She has thalamic foci.  There are multiple periventricular, deep and juxtacortical white matter foci in the hemispheres. Some of these are oriented to the ventricles. Some are hypointense on T1-weighted images. None enhance.  C- Spine without plaques.    REVIEW OF SYSTEMS: Constitutional: No fevers, chills, sweats, or change in appetite.   She reports fatigue and insomnia. Eyes: as  above Ear, nose and throat: No hearing loss, ear pain, nasal congestion, sore throat.   She has vertigo. Cardiovascular: No chest pain, palpitations Respiratory: No shortness of breath at rest or with exertion.   No wheezes GastrointestinaI: No nausea, vomiting, diarrhea, abdominal pain, fecal incontinence Genitourinary: No dysuria, urinary retention or frequency.  No nocturia. Musculoskeletal: No neck pain.  She  notesback pain Integumentary: No rash, pruritus, skin lesions Neurological: as above Psychiatric: No depression at this time.  No anxiety Endocrine: No palpitations, diaphoresis, change in appetite, change in weigh or increased thirst Hematologic/Lymphatic: No anemia, purpura, petechiae.   She feels that she bruises easily. Allergic/Immunologic: No itchy/runny eyes, nasal congestion, recent allergic reactions, rashes  ALLERGIES: Allergies  Allergen Reactions  . Amoxicillin Shortness Of Breath  . Penicillins Shortness Of Breath    Skin turns red  . Bupropion Other (See Comments)    Mood changes    HOME MEDICATIONS:  Current outpatient prescriptions:  .  acetaminophen-codeine (TYLENOL #3) 300-30 MG per tablet, Take 1 tablet by mouth every 8 (eight) hours as needed for moderate pain., Disp: 45 tablet, Rfl: 1 .  amphetamine-dextroamphetamine (ADDERALL XR) 30 MG 24 hr capsule, Take 1 capsule (30 mg total) by mouth daily., Disp: 30 capsule, Rfl: 0 .  cholecalciferol (VITAMIN D) 1000 UNITS tablet, Take 1,000 Units by mouth daily., Disp: , Rfl:  .  COPAXONE 40 MG/ML SOSY, Inject 40 mg into the skin 3 (three) times a week., Disp: 36 Syringe, Rfl: 3 .  diclofenac sodium (VOLTAREN) 1 % GEL, Apply topically., Disp: , Rfl:  .  doxepin (SINEQUAN) 10 MG capsule, Take 1 capsule (10 mg total) by mouth at bedtime., Disp: 30 capsule, Rfl: 5 .  Fluticasone Furoate-Vilanterol (BREO ELLIPTA) 100-25 MCG/INH AEPB, Inhale into the lungs., Disp: , Rfl:  .  lamoTRIgine (LAMICTAL) 100 MG tablet,  Take 300 mg by mouth 2 (two) times daily. , Disp: , Rfl: 11 .  levETIRAcetam (KEPPRA) 500 MG tablet, Take 1 tablet (500 mg total) by mouth 2 (two) times daily., Disp: 60 tablet, Rfl: 11 .  meloxicam (MOBIC) 15 MG tablet, Take 1 tablet (15 mg total) by mouth daily., Disp: 30 tablet, Rfl: 0 .  methylPREDNISolone (MEDROL DOSEPAK) 4 MG TBPK tablet, Take as directed, Disp: 21 tablet, Rfl: 0 .  ondansetron (ZOFRAN) 8 MG tablet, Take 1 tablet (8 mg total) by mouth every 8 (eight) hours as needed for nausea or vomiting., Disp: 12 tablet, Rfl: 3 .  VITAMIN B1-B12 IM, Inject 1,000 mcg into the muscle every 30 (thirty) days., Disp: , Rfl:  .  zonisamide (ZONEGRAN) 100 MG capsule, Take 1 capsule (100 mg total) by mouth daily.,  Disp: 30 capsule, Rfl: 11 No current facility-administered medications for this visit.  Facility-Administered Medications Ordered in Other Visits:  .  gadopentetate dimeglumine (MAGNEVIST) injection 12 mL, 12 mL, Intravenous, Once PRN, Asa Lente, MD  PAST MEDICAL HISTORY: Past Medical History  Diagnosis Date  . Multiple sclerosis (HCC)   . Chronic headaches   . Seizures (HCC)     PAST SURGICAL HISTORY: Past Surgical History  Procedure Laterality Date  . Cosmetic surgery    . Abdominal surgery      FAMILY HISTORY: Family History  Problem Relation Age of Onset  . Cancer Father     SOCIAL HISTORY:  Social History   Social History  . Marital Status: Married    Spouse Name: Richard  . Number of Children: 2  . Years of Education: 12   Occupational History  . unemployeed    Social History Main Topics  . Smoking status: Former Games developer  . Smokeless tobacco: Not on file     Comment: quit 2010  . Alcohol Use: No  . Drug Use: No  . Sexual Activity:    Partners: Male   Other Topics Concern  . Not on file   Social History Narrative   Lives at home with husband and children   Drinks caffeine: 2 cups a day     PHYSICAL EXAM  Filed Vitals:   06/20/15  1407  BP: 108/56  Pulse: 94  Resp: 14  Height: 6\' 3"  (1.905 m)  Weight: 108 lb (48.988 kg)    Body mass index is 13.5 kg/(m^2).   General: The patient is well-developed and well-nourished and in no acute distress   Neurologic Exam  Mental status: The patient is alert and oriented x 3 at the time of the examination. The patient has apparent normal recent and remote memory, with an apparently normal attention span and concentration ability.   Speech is normal.  Cranial nerves: Extraocular movements show left nystagmus on left gaze.  Facial symmetry is present. There is good facial sensation to soft touch bilaterally.Facial strength is normal.  Trapezius and sternocleidomastoid strength is normal. No dysarthria is noted.  The tongue is midline, and the patient has symmetric elevation of the soft palate. No obvious hearing deficits are noted.  Motor:  Muscle bulk is normal.   Tone is normal. Strength is  5 / 5 in all 4 extremities.   Sensory: Sensory testing is intact to pinprick, soft touch and vibration sensation in all 4 extremities.  Coordination: Cerebellar testing reveals good finger-nose-finger and heel-to-shin bilaterally.  Gait and station: Station is normal.   Gait is mildly wide. Tandem gait is wide. Romberg is negative.   Reflexes: Deep tendon reflexes are symmetric and increased (spread at knees) bilaterally.      DIAGNOSTIC DATA (LABS, IMAGING, TESTING) - I reviewed patient records, labs, notes, testing and imaging myself where available.     ASSESSMENT AND PLAN  Multiple sclerosis (HCC)  Ataxic gait  Diplopia  Other fatigue  Disturbed cognition  Partial epilepsy with impairment of consciousness (HCC)  Neck pain  Other headache syndrome    1.  Continue Keppra and lamotrigine for seizures.  2.    Right splenius capitis trigger point injection with 80 mg Depo-Medrol in Marcaine using sterile technique.   continue Adderall for sleepiness and  fatigue 3.   IV Solu-Medrol one gram today and po taper for optic neuritis exacerbation.   If she has another exacerbation over the next MRI of the  brain later this year shows significant changes, we will switch to a different medication. 4.   continue to stay active and exercises as tolerated.  5 .   She will return to see me in 4 months or sooner if she has new or worsening neurologic symptoms    45 minutes face-to-face with greater than one half of the time counseling and coordinating care about MS and related symptoms.  Richard A. Epimenio Foot, MD, PhD 06/20/2015, 2:15 PM Certified in Neurology, Clinical Neurophysiology, Sleep Medicine, Pain Medicine and Neuroimaging  [facility] [address] [place], [place] [address] [phone] f --r and=

## 2015-06-21 ENCOUNTER — Telehealth: Payer: Self-pay | Admitting: Neurology

## 2015-06-21 NOTE — Telephone Encounter (Signed)
Pt called said pharmacy has not received the RX for seizures. Pt was told yesterday she could have 3 days IV at =, she is wanting to do that since.

## 2015-06-21 NOTE — Telephone Encounter (Signed)
I spoke with [name] shortly after she called this afternoon, and am documenting our conversation late.  She stated that her pharmacy did not have rx. for Medrol dose pk that RAS escribed yesterday.  She requested 2nd day of IV SM.  Per RAS, ok for 2nd day of SM 1gm IV, and then 3rd day prn. (optic neuritis.)  Pt. was seen in the infusion suite and SM 1gm IV was infused by Inetta Fermo this afternoon.  Pt. will call me back tomorrow if she is still having sx. and needs 3rd day/fim

## 2015-06-22 ENCOUNTER — Encounter: Payer: Self-pay | Admitting: Neurology

## 2015-06-26 ENCOUNTER — Telehealth: Payer: Self-pay | Admitting: *Deleted

## 2015-06-26 NOTE — Telephone Encounter (Signed)
I have spoken with Brendalis, who sts. she  has received info in an email (not from our office, and not something RAS has discussed with her), on a clinical trial for MS pt's.  Sts. the trial is referred to by a #--no med. name given.  Wants to know if it would be safe to participate in this trial.  I have advsied that, without knowing what med trial is researching, it is impossible to answer this question.  Dr. Epimenio Foot has not discuss a clinical trial with her, has not referred her to our research dept. to discuss a trial. She verbalized understanding of same/fim

## 2015-06-26 NOTE — Telephone Encounter (Signed)
Pt has question need you to call as soon as you can. Please call  [phone]

## 2015-06-27 ENCOUNTER — Telehealth: Payer: Self-pay | Admitting: Neurology

## 2015-06-27 NOTE — Telephone Encounter (Signed)
I have spoken with [facility] and verified high dose Prednisone taper/fim

## 2015-06-27 NOTE — Telephone Encounter (Signed)
Ann with [facility] is calling to get clarification on Rx predniSONE (DELTASONE) 50 MG tablet for the patient-Ref.#[id number].

## 2015-07-04 ENCOUNTER — Encounter: Payer: Self-pay | Admitting: Neurology

## 2015-07-06 ENCOUNTER — Encounter: Payer: Self-pay | Admitting: Neurology

## 2015-07-19 ENCOUNTER — Encounter: Payer: Self-pay | Admitting: Neurology

## 2015-07-19 MED ORDER — SULFAMETHOXAZOLE-TRIMETHOPRIM 800-160 MG PO TABS: 1.0000 | ORAL_TABLET | Freq: Two times a day (BID) | ORAL | Status: DC

## 2015-07-19 NOTE — Telephone Encounter (Signed)
Rx. for Septra DS called to [facility] in [facility] phone # [phone].  See email response to pt. for further details./fim

## 2015-08-07 ENCOUNTER — Ambulatory Visit: Payer: Self-pay | Admitting: Neurology

## 2015-08-08 ENCOUNTER — Encounter: Payer: Self-pay | Admitting: Neurology

## 2015-08-11 ENCOUNTER — Encounter: Payer: Self-pay | Admitting: Neurology

## 2015-08-15 ENCOUNTER — Encounter: Payer: Self-pay | Admitting: Neurology

## 2015-08-15 ENCOUNTER — Ambulatory Visit (INDEPENDENT_AMBULATORY_CARE_PROVIDER_SITE_OTHER): Payer: 59 | Admitting: Neurology

## 2015-08-15 VITALS — BP 100/70 | HR 68 | Resp 4 | Ht 61.5 in | Wt 107.0 lb

## 2015-08-15 DIAGNOSIS — G4489 Other headache syndrome: Secondary | ICD-10-CM | POA: Diagnosis not present

## 2015-08-15 DIAGNOSIS — R4189 Other symptoms and signs involving cognitive functions and awareness: Secondary | ICD-10-CM | POA: Diagnosis not present

## 2015-08-15 DIAGNOSIS — G35 Multiple sclerosis: Secondary | ICD-10-CM | POA: Diagnosis not present

## 2015-08-15 DIAGNOSIS — H532 Diplopia: Secondary | ICD-10-CM

## 2015-08-15 DIAGNOSIS — R260 Ataxic gait: Secondary | ICD-10-CM | POA: Diagnosis not present

## 2015-08-15 DIAGNOSIS — R5383 Other fatigue: Secondary | ICD-10-CM

## 2015-08-15 DIAGNOSIS — G40209 Localization-related (focal) (partial) symptomatic epilepsy and epileptic syndromes with complex partial seizures, not intractable, without status epilepticus: Secondary | ICD-10-CM | POA: Diagnosis not present

## 2015-08-15 MED ORDER — METHYLPREDNISOLONE 4 MG PO TBPK: ORAL_TABLET | ORAL | Status: DC

## 2015-08-15 MED ORDER — AMPHETAMINE-DEXTROAMPHET ER 30 MG PO CP24: 30.0000 mg | ORAL_CAPSULE | Freq: Every day | ORAL | Status: DC

## 2015-08-15 MED ORDER — AMANTADINE HCL 100 MG PO CAPS: 100.0000 mg | ORAL_CAPSULE | Freq: Two times a day (BID) | ORAL | Status: DC

## 2015-08-15 NOTE — Progress Notes (Signed)
Marland Kitchen   [facility]  PATIENT: Paula Anderson DOB: 05/01/1975  REFERRING DOCTOR OR PCP:  Delbert Harness    Fax [phone] ______________________________   HISTORICAL  CHIEF COMPLAINT:  Chief Complaint  Patient presents with  . Multiple Sclerosis    Sts. she continues to tolerate Copaxone well.  Sts. fatigue has been worse over the last week.  She missed one dose of Copaxone.  She would like to discuss Amantadine 100mg  bid that was offered, in addition to Adderall XR 30mg . Sts. she has intermittent pain in right arm, swelling right index finger./fim    HISTORY OF PRESENT ILLNESS:  Paula Anderson is a 40 yo woman with  multiple sclerosis and seizures.   The last few days fatigue is worse and the right arm has been hurting.   She has had very mild neck pain but she has had this off/on x years.     Her MS seems stable on Copaxone and she tolerates it well.   No exacerbations.     Vision:    She feels vision is better than last visit but still poor on her left.   She notes mild asymmetry with brightness and color.       HA:   For the past week, she has had a headache on the right side that appears to radiate from the occiput forward. She has not noted much pain in the neck itself. She has nausea and also has had photophobia and phonophobia. Movements did not change the pain much.  Seizures: She has had no seizures since  switched to Keppra and lamotrigine.       Last one was early 2017 with  transient LOC.    She gets a glazed over look and is briefly unresponsive (20-40 seconds).     Vimpat did not help.    She had an EEG in [place] that reportedly had spikes but we do not have those results at this time.  Gait/strength/sensation: She feels off balanced, worse the past week.   Her legs were giving out a lot.   She stumbles and has needed to grab on to something for balance .   She notes mild leg weakness, right leg sometimes shakes. She has tingling in her legs but no more  pronounced numbness.     Bladder/bowel:  She notes needing to urinate x 10 most days and x 1 most nights.   This is more than last year.      Fatigue/sleep:  She has had more difficultywith fatigue, physical more than mental.  Adderall has helped but has caused decreased appetite.    She has insomnia. She was placed on Ambien but it had a hangover effect the next day.    She also was prescribed a medication, possibly Provigil, to help her stay awake more during the daytime.  Mood/cognition:   She denies depression but notes irritability   She denies anxiety.    She has noted difficulty with cognition, especially decreased short-term memory, decreased ability to come up with the right word and decreased focus.   Adderall did not help focus much.  MS History:   She presented with difficulties with speech, memory and gait balance in 2013. MRIs were consistent with MS.   In 2014, she had a severe episode of vertigo and was taken to the hospital. Repeat MRI imaging was performed and she was diagnosed officially with MS. She was started on Copaxone.   She received IV steroids couple times  when she had severe fatigue.     She tolerates Copaxone well.    No difficulty with skin reactions.  I personally reviewed MRI images of the brain and spine.   In the posterior fossa there is a right middle cerebellar peduncle lesion, possible medulla focus and left midbrain focus and two small cerebellar hemisphere foci. She has thalamic foci.  There are multiple periventricular, deep and juxtacortical white matter foci in the hemispheres. Some of these are oriented to the ventricles. Some are hypointense on T1-weighted images. None enhance.  C- Spine without plaques.    REVIEW OF SYSTEMS: Constitutional: No fevers, chills, sweats, or change in appetite.   She reports fatigue and insomnia. Eyes: as above Ear, nose and throat: No hearing loss, ear pain, nasal congestion, sore throat.   She has vertigo. Cardiovascular: No  chest pain, palpitations Respiratory: No shortness of breath at rest or with exertion.   No wheezes GastrointestinaI: No nausea, vomiting, diarrhea, abdominal pain, fecal incontinence Genitourinary: No dysuria, urinary retention or frequency.  No nocturia. Musculoskeletal: No neck pain.  She  notesback pain Integumentary: No rash, pruritus, skin lesions Neurological: as above Psychiatric: No depression at this time.  No anxiety Endocrine: No palpitations, diaphoresis, change in appetite, change in weigh or increased thirst Hematologic/Lymphatic: No anemia, purpura, petechiae.   She feels that she bruises easily. Allergic/Immunologic: No itchy/runny eyes, nasal congestion, recent allergic reactions, rashes  ALLERGIES: Allergies  Allergen Reactions  . Amoxicillin Shortness Of Breath  . Penicillins Shortness Of Breath    Skin turns red  . Bupropion Other (See Comments)    Mood changes    HOME MEDICATIONS:  Current outpatient prescriptions:  .  acetaminophen-codeine (TYLENOL #3) 300-30 MG per tablet, Take 1 tablet by mouth every 8 (eight) hours as needed for moderate pain., Disp: 45 tablet, Rfl: 1 .  amphetamine-dextroamphetamine (ADDERALL XR) 30 MG 24 hr capsule, Take 1 capsule (30 mg total) by mouth daily., Disp: 30 capsule, Rfl: 0 .  cholecalciferol (VITAMIN D) 1000 UNITS tablet, Take 1,000 Units by mouth daily., Disp: , Rfl:  .  COPAXONE 40 MG/ML SOSY, Inject 40 mg into the skin 3 (three) times a week., Disp: 36 Syringe, Rfl: 3 .  diclofenac sodium (VOLTAREN) 1 % GEL, Apply topically., Disp: , Rfl:  .  doxepin (SINEQUAN) 10 MG capsule, Take 1 capsule (10 mg total) by mouth at bedtime., Disp: 30 capsule, Rfl: 5 .  Fluticasone Furoate-Vilanterol (BREO ELLIPTA) 100-25 MCG/INH AEPB, Inhale into the lungs., Disp: , Rfl:  .  lamoTRIgine (LAMICTAL) 100 MG tablet, Take 300 mg by mouth 2 (two) times daily. , Disp: , Rfl: 11 .  levETIRAcetam (KEPPRA) 500 MG tablet, Take 1 tablet (500 mg  total) by mouth 2 (two) times daily., Disp: 60 tablet, Rfl: 11 .  meloxicam (MOBIC) 15 MG tablet, Take 1 tablet (15 mg total) by mouth daily., Disp: 30 tablet, Rfl: 0 .  methylPREDNISolone (MEDROL DOSEPAK) 4 MG TBPK tablet, Take as directed, Disp: 21 tablet, Rfl: 0 .  ondansetron (ZOFRAN) 8 MG tablet, Take 1 tablet (8 mg total) by mouth every 8 (eight) hours as needed for nausea or vomiting., Disp: 12 tablet, Rfl: 3 .  predniSONE (DELTASONE) 50 MG tablet, Take 10 po x 2 days, then 6 po x 1d, then 4 po x 1d, then 2 po x 1d then 1 po x 1 day for MS exacerbation, Disp: 33 tablet, Rfl: 0 .  sulfamethoxazole-trimethoprim (BACTRIM DS,SEPTRA DS) 800-160 MG tablet, Take 1  tablet by mouth 2 (two) times daily., Disp: 14 tablet, Rfl: 0 .  VITAMIN B1-B12 IM, Inject 1,000 mcg into the muscle every 30 (thirty) days., Disp: , Rfl:  .  zonisamide (ZONEGRAN) 100 MG capsule, Take 1 capsule (100 mg total) by mouth daily., Disp: 30 capsule, Rfl: 11 No current facility-administered medications for this visit.  Facility-Administered Medications Ordered in Other Visits:  .  gadopentetate dimeglumine (MAGNEVIST) injection 12 mL, 12 mL, Intravenous, Once PRN, Asa Lente, MD  PAST MEDICAL HISTORY: Past Medical History  Diagnosis Date  . Multiple sclerosis (HCC)   . Chronic headaches   . Seizures (HCC)     PAST SURGICAL HISTORY: Past Surgical History  Procedure Laterality Date  . Cosmetic surgery    . Abdominal surgery      FAMILY HISTORY: Family History  Problem Relation Age of Onset  . Cancer Father     SOCIAL HISTORY:  Social History   Social History  . Marital Status: Married    Spouse Name: Odean Fester  . Number of Children: 2  . Years of Education: 12   Occupational History  . unemployeed    Social History Main Topics  . Smoking status: Former Games developer  . Smokeless tobacco: Not on file     Comment: quit 2010  . Alcohol Use: No  . Drug Use: No  . Sexual Activity:    Partners: Male    Other Topics Concern  . Not on file   Social History Narrative   Lives at home with husband and children   Drinks caffeine: 2 cups a day     PHYSICAL EXAM  Filed Vitals:   08/15/15 1603  BP: 100/70  Pulse: 68  Resp: 4  Height: 5' 1.5" (1.562 m)  Weight: 107 lb (48.535 kg)    Body mass index is 19.89 kg/(m^2).   General: The patient is well-developed and well-nourished and in no acute distress  Neck:    Neck with good ROM and nontender.  Musculoskeletal:   Good ROM in arms/shoulders and shoulders are nontender.      Neurologic Exam  Mental status: The patient is alert and oriented x 3 at the time of the examination. The patient has apparent normal recent and remote memory, with an apparently normal attention span and concentration ability.   Speech is normal.  Cranial nerves: Extraocular movements show left nystagmus on left gaze.  Facial symmetry is present. There is good facial sensation to soft touch bilaterally.Facial strength is normal.  Trapezius and sternocleidomastoid strength is normal. No dysarthria is noted.  The tongue is midline, and the patient has symmetric elevation of the soft palate. No obvious hearing deficits are noted.  Motor:  Muscle bulk is normal.   Tone is normal. Strength is  5 / 5 in all 4 extremities.   Sensory: Sensory testing is intact to pinprick, soft touch and vibration sensation in all 4 extremities.  Coordination: Cerebellar testing reveals good finger-nose-finger and heel-to-shin bilaterally.  Gait and station: Station is normal.   Gait is mildly wide. Tandem gait is wide. Romberg is negative.   Reflexes: Deep tendon reflexes are symmetric and increased (spread at knees) bilaterally.      DIAGNOSTIC DATA (LABS, IMAGING, TESTING) - I reviewed patient records, labs, notes, testing and imaging myself where available.     ASSESSMENT AND PLAN  Multiple sclerosis (HCC)  Ataxic gait  Diplopia  Disturbed cognition  Other  fatigue  Other headache syndrome  Partial epilepsy with impairment  of consciousness (HCC)    1.    Continue Keppra and lamotrigine for seizures.   Continue Copaxone for MS 2.    Steroid dose pack for neck/arm pain 3.    continue Adderall for sleepiness and fatigue.,   Add amantadine 100 mg po bid 4.    continue to stay active and exercises as tolerated.  5 .   She will return to see me in 4 months or sooner if she has new or worsening neurologic symptoms     Lyndal Alamillo A. Epimenio Foot, MD, PhD 08/15/2015, 4:24 PM Certified in Neurology, Clinical Neurophysiology, Sleep Medicine, Pain Medicine and Neuroimaging  [facility] [address], [address] [place], [place] [address] [phone]

## 2015-08-16 ENCOUNTER — Telehealth: Payer: Self-pay | Admitting: *Deleted

## 2015-08-16 ENCOUNTER — Encounter: Payer: Self-pay | Admitting: Neurology

## 2015-08-16 MED ORDER — METHYLPREDNISOLONE 4 MG PO TBPK: ORAL_TABLET | ORAL | Status: DC

## 2015-08-16 MED ORDER — AMANTADINE HCL 100 MG PO CAPS: 100.0000 mg | ORAL_CAPSULE | Freq: Two times a day (BID) | ORAL | Status: DC

## 2015-08-16 NOTE — Telephone Encounter (Signed)
Amantadine and Medrol dose pk. sent to [facility] per pt's request./fim

## 2015-08-21 NOTE — Telephone Encounter (Signed)
Phone call from [place]/[facility] [phone] needs clarification on directions for methylPREDNISolone (MEDROL DOSEPAK) 4 MG TBPK tablet, Reference #[id number].

## 2015-08-21 NOTE — Telephone Encounter (Signed)
I have spoken with [facility] and advised directions for Medrol dose pk are #21 tabs, start with 6 on day 1 and decrease by 1 each day until gone..fun/fim

## 2015-08-29 ENCOUNTER — Encounter: Payer: Self-pay | Admitting: Neurology

## 2015-08-30 ENCOUNTER — Telehealth: Payer: Self-pay | Admitting: *Deleted

## 2015-08-30 MED ORDER — ZONISAMIDE 100 MG PO CAPS: 100.0000 mg | ORAL_CAPSULE | Freq: Two times a day (BID) | ORAL | Status: DC

## 2015-08-30 MED ORDER — METHYLPREDNISOLONE 4 MG PO TBPK: ORAL_TABLET | ORAL | Status: DC

## 2015-08-30 NOTE — Telephone Encounter (Signed)
See pt's email.  Pt. c/o increased h/a's.  Per RAS, ok for Medrol dose pk and to increase Zonegran to 100mg  bid.  Pt. is agreeable.  Rx's escribed to [facility] per her request/fim

## 2015-08-31 ENCOUNTER — Telehealth: Payer: Self-pay | Admitting: *Deleted

## 2015-08-31 MED ORDER — AMPHETAMINE-DEXTROAMPHETAMINE 20 MG PO TABS: 20.0000 mg | ORAL_TABLET | Freq: Two times a day (BID) | ORAL | Status: DC

## 2015-08-31 NOTE — Telephone Encounter (Signed)
Due to pt c/o continued fatigue, per RAS, ok to increase Adderall to  (IR) bid.  Rx. up front [facility]/fim

## 2015-09-05 ENCOUNTER — Encounter: Payer: Self-pay | Admitting: Neurology

## 2015-09-15 ENCOUNTER — Encounter: Payer: Self-pay | Admitting: Neurology

## 2015-09-26 ENCOUNTER — Ambulatory Visit: Payer: Self-pay | Admitting: Neurology

## 2015-09-28 ENCOUNTER — Encounter: Payer: Self-pay | Admitting: Neurology

## 2015-09-28 ENCOUNTER — Ambulatory Visit (INDEPENDENT_AMBULATORY_CARE_PROVIDER_SITE_OTHER): Payer: 59 | Admitting: Neurology

## 2015-09-28 VITALS — BP 88/61 | HR 108 | Resp 14 | Ht 62.0 in | Wt 105.0 lb

## 2015-09-28 DIAGNOSIS — G40209 Localization-related (focal) (partial) symptomatic epilepsy and epileptic syndromes with complex partial seizures, not intractable, without status epilepticus: Secondary | ICD-10-CM | POA: Diagnosis not present

## 2015-09-28 DIAGNOSIS — R5383 Other fatigue: Secondary | ICD-10-CM

## 2015-09-28 DIAGNOSIS — H532 Diplopia: Secondary | ICD-10-CM

## 2015-09-28 DIAGNOSIS — R260 Ataxic gait: Secondary | ICD-10-CM | POA: Diagnosis not present

## 2015-09-28 DIAGNOSIS — G35 Multiple sclerosis: Secondary | ICD-10-CM | POA: Diagnosis not present

## 2015-09-28 DIAGNOSIS — G4489 Other headache syndrome: Secondary | ICD-10-CM

## 2015-09-28 MED ORDER — AMPHETAMINE-DEXTROAMPHET ER 30 MG PO CP24: 30.0000 mg | ORAL_CAPSULE | Freq: Every day | ORAL | 0 refills | Status: DC

## 2015-09-28 MED ORDER — IMIPRAMINE HCL 25 MG PO TABS: ORAL_TABLET | ORAL | 3 refills | Status: DC

## 2015-09-28 NOTE — Progress Notes (Signed)
Marland Kitchen   [facility]  PATIENT: Paula Anderson DOB: 11/06/1975  REFERRING DOCTOR OR PCP:  Delbert Harness    Fax [phone] ______________________________   HISTORICAL  CHIEF COMPLAINT:  Chief Complaint  Patient presents with  . Headache    Rm 13. Patient would like to discuss treatment for headaches. Currently only taking ASA for her headaches. Patient has headaches daily.     HISTORY OF PRESENT ILLNESS:  Paula Anderson is a 40 yo woman with  multiple sclerosis and seizures.     She is noting more headaches.  HA:   For the past couple months, she has had daily headaches.  They are more frontal and she has less occipital pain than in the past.    She has not noted much pain in the neck. She denies nausea but has photophobia and phonophobia when HA is worse (looking at bright phone especially bad). Movements did not change the pain much.    Aspirin does not help much and other NSAIDs don't help at all  MS:   Her MS seems stable on Copaxone and she tolerates it well.   No exacerbations.   She has fallen a few times in the last month  Seizures: She has had no seizures since  switched to Keppra and lamotrigine.       Last one was early 2017 with  transient LOC.    She gets a glazed over look and is briefly unresponsive (20-40 seconds).    Vimpat did not help.    She had an EEG in [place] that reportedly had spikes but we do not have those results at this time.    Gait/strength/sensation: She feels off balanced, and her right leg drags at times.   She stumbles and has needed to grab on to something for balance .    She has tingling in her legs but no more pronounced numbness.     Bladder/bowel:  She notes needing to urinate x 10 most days and x 1 most nights.   This is more than last year.      Vision:    She feels vision is reduced on her left.   She feels she does not see to the left as well.  She notes mild asymmetry with brightness and color.       Fatigue/sleep:  She has had  more difficulty with fatigue, physical more than mental.  Adderall has helped.   She did better with XR 30 in the am (compared to 20 mg bid) and wishes to go back to this.   but has caused decreased appetite.    She has insomnia. She was placed on Ambien but it had a hangover effect the next day.    She also was prescribed a medication, possibly Provigil, to help her stay awake more during the daytime.  Mood/cognition:   She denies depression but notes irritability   She denies anxiety.    She has noted difficulty with cognition, especially decreased short-term memory, decreased ability to come up with the right word and decreased focus.     MS History:   She presented with difficulties with speech, memory and gait balance in 2013. MRIs were consistent with MS.   In 2014, she had a severe episode of vertigo and was taken to the hospital. Repeat MRI imaging was performed and she was diagnosed officially with MS. She was started on Copaxone.   She received IV steroids couple times when she had severe fatigue.  She tolerates Copaxone well.    No difficulty with skin reactions.  I personally reviewed MRI images of the brain and spine.   In the posterior fossa there is a right middle cerebellar peduncle lesion, possible medulla focus and left midbrain focus and two small cerebellar hemisphere foci. She has thalamic foci.  There are multiple periventricular, deep and juxtacortical white matter foci in the hemispheres. Some of these are oriented to the ventricles. Some are hypointense on T1-weighted images. None enhance.  C- Spine without plaques.    REVIEW OF SYSTEMS: Constitutional: No fevers, chills, sweats, or change in appetite.   She reports fatigue and insomnia. Eyes: as above Ear, nose and throat: No hearing loss, ear pain, nasal congestion, sore throat.   She has vertigo. Cardiovascular: No chest pain, palpitations Respiratory: No shortness of breath at rest or with exertion.   No  wheezes GastrointestinaI: No nausea, vomiting, diarrhea, abdominal pain, fecal incontinence Genitourinary: No dysuria, urinary retention or frequency.  No nocturia. Musculoskeletal: No neck pain.  She  notesback pain Integumentary: No rash, pruritus, skin lesions Neurological: as above Psychiatric: No depression at this time.  No anxiety Endocrine: No palpitations, diaphoresis, change in appetite, change in weigh or increased thirst Hematologic/Lymphatic: No anemia, purpura, petechiae.   She feels that she bruises easily. Allergic/Immunologic: No itchy/runny eyes, nasal congestion, recent allergic reactions, rashes  ALLERGIES: Allergies  Allergen Reactions  . Amoxicillin Shortness Of Breath  . Penicillins Shortness Of Breath    Skin turns red  . Bupropion Other (See Comments)    Mood changes    HOME MEDICATIONS:  Current Outpatient Prescriptions:  .  acetaminophen-codeine (TYLENOL #3) 300-30 MG per tablet, Take 1 tablet by mouth every 8 (eight) hours as needed for moderate pain., Disp: 45 tablet, Rfl: 1 .  amantadine (SYMMETREL) 100 MG capsule, Take 1 capsule (100 mg total) by mouth 2 (two) times daily., Disp: 60 capsule, Rfl: 11 .  amphetamine-dextroamphetamine (ADDERALL) 20 MG tablet, Take 1 tablet (20 mg total) by mouth 2 (two) times daily., Disp: 60 tablet, Rfl: 0 .  cholecalciferol (VITAMIN D) 1000 UNITS tablet, Take 1,000 Units by mouth daily., Disp: , Rfl:  .  COPAXONE 40 MG/ML SOSY, Inject 40 mg into the skin 3 (three) times a week., Disp: 36 Syringe, Rfl: 3 .  doxepin (SINEQUAN) 10 MG capsule, Take 1 capsule (10 mg total) by mouth at bedtime., Disp: 30 capsule, Rfl: 5 .  Fluticasone Furoate-Vilanterol (BREO ELLIPTA) 100-25 MCG/INH AEPB, Inhale into the lungs., Disp: , Rfl:  .  lamoTRIgine (LAMICTAL) 100 MG tablet, Take 300 mg by mouth 2 (two) times daily. , Disp: , Rfl: 11 .  levETIRAcetam (KEPPRA) 500 MG tablet, Take 1 tablet (500 mg total) by mouth 2 (two) times daily.,  Disp: 60 tablet, Rfl: 11 .  meloxicam (MOBIC) 15 MG tablet, Take 1 tablet (15 mg total) by mouth daily., Disp: 30 tablet, Rfl: 0 .  methylPREDNISolone (MEDROL DOSEPAK) 4 MG TBPK tablet, Take 6 tablets on day 1, 5 tablets on day 2, 4 tablets on day 3, 3 tablets on day 4, 2 tablets on day 5, and 1 tablet on day 6., Disp: 21 tablet, Rfl: 0 .  ondansetron (ZOFRAN) 8 MG tablet, Take 1 tablet (8 mg total) by mouth every 8 (eight) hours as needed for nausea or vomiting., Disp: 12 tablet, Rfl: 3 .  predniSONE (DELTASONE) 50 MG tablet, Take 10 po x 2 days, then 6 po x 1d, then 4 po x 1d,  then 2 po x 1d then 1 po x 1 day for MS exacerbation, Disp: 33 tablet, Rfl: 0 .  sulfamethoxazole-trimethoprim (BACTRIM DS,SEPTRA DS) 800-160 MG tablet, Take 1 tablet by mouth 2 (two) times daily., Disp: 14 tablet, Rfl: 0 .  VITAMIN B1-B12 IM, Inject 1,000 mcg into the muscle every 30 (thirty) days., Disp: , Rfl:  .  zonisamide (ZONEGRAN) 100 MG capsule, Take 1 capsule (100 mg total) by mouth 2 (two) times daily., Disp: 60 capsule, Rfl: 11 No current facility-administered medications for this visit.   Facility-Administered Medications Ordered in Other Visits:  .  gadopentetate dimeglumine (MAGNEVIST) injection 12 mL, 12 mL, Intravenous, Once PRN, Asa Lente, MD  PAST MEDICAL HISTORY: Past Medical History:  Diagnosis Date  . Chronic headaches   . Multiple sclerosis (HCC)   . Seizures (HCC)     PAST SURGICAL HISTORY: Past Surgical History:  Procedure Laterality Date  . ABDOMINAL SURGERY    . COSMETIC SURGERY      FAMILY HISTORY: Family History  Problem Relation Age of Onset  . Cancer Father     SOCIAL HISTORY:  Social History   Social History  . Marital status: Married    Spouse name: Priyal Musquiz  . Number of children: 2  . Years of education: 12   Occupational History  . unemployeed    Social History Main Topics  . Smoking status: Former Games developer  . Smokeless tobacco: Never Used     Comment:  quit 2010  . Alcohol use No  . Drug use: No  . Sexual activity: Yes    Partners: Male   Other Topics Concern  . Not on file   Social History Narrative   Lives at home with husband and children   Drinks caffeine: 2 cups a day     PHYSICAL EXAM  Vitals:   09/28/15 1054  BP: (!) 88/61  Pulse: (!) 108  Resp: 14  Weight: 105 lb (47.6 kg)  Height: 5\' 2"  (1.575 m)    Body mass index is 19.2 kg/m.   General: The patient is well-developed and well-nourished and in no acute distress  Neck:    Neck with good ROM and nontender.   Temporsal region nontender  Musculoskeletal:   Good ROM in arms/shoulders and shoulders are nontender.      Neurologic Exam  Mental status: The patient is alert and oriented x 3 at the time of the examination. The patient has apparent normal recent and remote memory, with an apparently normal attention span and concentration ability.   Speech is normal.  Cranial nerves: Extraocular movements show left nystagmus on left gaze. Decreased color saturation OS Facial symmetry is present. There is good facial sensation to soft touch bilaterally.Facial strength is normal.  Trapezius and sternocleidomastoid strength is normal. No dysarthria is noted.  The tongue is midline, and the patient has symmetric elevation of the soft palate. No obvious hearing deficits are noted.  Motor:  Muscle bulk is normal.   Tone is normal. Strength is  5 / 5 in all 4 extremities.   Sensory: Sensory testing shows mild reduced temperature sensation on the right arm/leg.   More symmetric vibration.     Coordination: Cerebellar testing reveals good finger-nose-finger and heel-to-shin bilaterally.  Gait and station: Station is normal.   Gait is mildly wide. Tandem gait is wide. Romberg is negative.   Reflexes: Deep tendon reflexes are symmetric and increased (spread at knees) bilaterally.      DIAGNOSTIC DATA (LABS,  IMAGING, TESTING) - I reviewed patient records, labs, notes,  testing and imaging myself where available.     ASSESSMENT AND PLAN  Multiple sclerosis (HCC)  Partial epilepsy with impairment of consciousness (HCC)  Ataxic gait  Diplopia  Other fatigue  Other headache syndrome    1.    Continue Keppra and lamotrigine for seizures.   Continue Copaxone for MS 2.    Imipramine 25-50 mg nightly to help with the headaches. She can stop doxepin since the imipramine will probably help her sleep. 3.    continue Adderall for sleepiness and fatigue go back to XR 30 mg.,    4.    continue to stay active and exercises as tolerated.  5 .   She will return to see me in 3 months or sooner if she has new or worsening neurologic symptoms     Jonathyn Carothers A. Epimenio Foot, MD, PhD 09/28/2015, 11:16 AM Certified in Neurology, Clinical Neurophysiology, Sleep Medicine, Pain Medicine and Neuroimaging  [facility] [address] [place], [place] [address] [phone]

## 2015-09-29 DIAGNOSIS — N644 Mastodynia: Secondary | ICD-10-CM | POA: Insufficient documentation

## 2015-10-04 ENCOUNTER — Encounter: Payer: Self-pay | Admitting: Neurology

## 2015-10-04 ENCOUNTER — Telehealth: Payer: Self-pay | Admitting: *Deleted

## 2015-10-04 ENCOUNTER — Other Ambulatory Visit: Payer: Self-pay | Admitting: Neurology

## 2015-10-04 MED ORDER — ONDANSETRON HCL 8 MG PO TABS: 8.0000 mg | ORAL_TABLET | Freq: Three times a day (TID) | ORAL | 3 refills | Status: DC | PRN

## 2015-10-04 NOTE — Telephone Encounter (Signed)
Zofran r/f escribed to [facility] per emailed request/fim

## 2015-10-09 ENCOUNTER — Telehealth: Payer: Self-pay | Admitting: *Deleted

## 2015-10-09 ENCOUNTER — Other Ambulatory Visit: Payer: Self-pay | Admitting: Neurology

## 2015-10-09 MED ORDER — MELOXICAM 15 MG PO TABS: 15.0000 mg | ORAL_TABLET | Freq: Every day | ORAL | 0 refills | Status: DC

## 2015-10-09 NOTE — Telephone Encounter (Signed)
Mobic r/f to [facility] per pt. request/fim

## 2015-10-19 ENCOUNTER — Other Ambulatory Visit (HOSPITAL_BASED_OUTPATIENT_CLINIC_OR_DEPARTMENT_OTHER): Payer: Self-pay | Admitting: Plastic Surgery

## 2015-10-19 DIAGNOSIS — Z1231 Encounter for screening mammogram for malignant neoplasm of breast: Secondary | ICD-10-CM

## 2015-10-23 ENCOUNTER — Other Ambulatory Visit: Payer: Self-pay | Admitting: Neurology

## 2015-10-23 ENCOUNTER — Encounter: Payer: Self-pay | Admitting: Neurology

## 2015-10-24 ENCOUNTER — Telehealth: Payer: Self-pay | Admitting: Neurology

## 2015-10-24 ENCOUNTER — Ambulatory Visit (HOSPITAL_BASED_OUTPATIENT_CLINIC_OR_DEPARTMENT_OTHER)
Admission: RE | Admit: 2015-10-24 | Discharge: 2015-10-24 | Disposition: A | Discharge status: Home / Self Care | Payer: 59 | Source: Ambulatory Visit | Attending: Plastic Surgery | Admitting: Plastic Surgery

## 2015-10-24 DIAGNOSIS — R928 Other abnormal and inconclusive findings on diagnostic imaging of breast: Secondary | ICD-10-CM | POA: Insufficient documentation

## 2015-10-24 DIAGNOSIS — Z1231 Encounter for screening mammogram for malignant neoplasm of breast: Secondary | ICD-10-CM | POA: Insufficient documentation

## 2015-10-24 MED ORDER — LAMOTRIGINE 100 MG PO TABS: 300.0000 mg | ORAL_TABLET | Freq: Two times a day (BID) | ORAL | 11 refills | Status: DC

## 2015-10-24 NOTE — Telephone Encounter (Signed)
Lamictal escribed to [facility] per request/fim

## 2015-10-24 NOTE — Telephone Encounter (Signed)
Patient requesting refill of lamoTRIgine (LAMICTAL) 100 MG tablet Pharmacy:[id number] - [place], [place] - [address] [facility] [address]      Pt is out of medication and is have withdrawls.

## 2015-10-26 ENCOUNTER — Telehealth: Payer: Self-pay | Admitting: *Deleted

## 2015-10-26 ENCOUNTER — Other Ambulatory Visit: Payer: Self-pay | Admitting: Plastic Surgery

## 2015-10-26 DIAGNOSIS — R928 Other abnormal and inconclusive findings on diagnostic imaging of breast: Secondary | ICD-10-CM

## 2015-10-26 MED ORDER — LAMOTRIGINE 100 MG PO TABS: 300.0000 mg | ORAL_TABLET | Freq: Two times a day (BID) | ORAL | 11 refills | Status: DC

## 2015-10-26 NOTE — Telephone Encounter (Signed)
Rx. escribed with correct quantity./fim

## 2015-10-31 ENCOUNTER — Ambulatory Visit
Admission: RE | Admit: 2015-10-31 | Discharge: 2015-10-31 | Disposition: A | Discharge status: Home / Self Care | Payer: 59 | Source: Ambulatory Visit | Attending: Plastic Surgery | Admitting: Plastic Surgery

## 2015-10-31 ENCOUNTER — Other Ambulatory Visit: Payer: Self-pay | Admitting: Plastic Surgery

## 2015-10-31 ENCOUNTER — Encounter: Payer: Self-pay | Admitting: Neurology

## 2015-10-31 DIAGNOSIS — R928 Other abnormal and inconclusive findings on diagnostic imaging of breast: Secondary | ICD-10-CM

## 2015-10-31 DIAGNOSIS — N6320 Unspecified lump in the left breast, unspecified quadrant: Secondary | ICD-10-CM

## 2015-11-06 ENCOUNTER — Other Ambulatory Visit: Payer: Self-pay | Admitting: Plastic Surgery

## 2015-11-06 ENCOUNTER — Ambulatory Visit
Admission: RE | Admit: 2015-11-06 | Discharge: 2015-11-06 | Disposition: A | Discharge status: Home / Self Care | Payer: 59 | Source: Ambulatory Visit | Attending: Plastic Surgery | Admitting: Plastic Surgery

## 2015-11-06 DIAGNOSIS — N6320 Unspecified lump in the left breast, unspecified quadrant: Secondary | ICD-10-CM

## 2015-11-07 ENCOUNTER — Other Ambulatory Visit: Payer: Self-pay | Admitting: Neurology

## 2015-11-08 MED ORDER — AMPHETAMINE-DEXTROAMPHET ER 30 MG PO CP24: 30.0000 mg | ORAL_CAPSULE | Freq: Every day | ORAL | 0 refills | Status: DC

## 2015-11-08 NOTE — Addendum Note (Signed)
Addended by: Crisoforo Oxford on: 11/08/2015 11:41 AM   Modules accepted: Orders

## 2015-11-08 NOTE — Telephone Encounter (Signed)
Rx printed and put on Dr. Bonnita Hollow desk for [employer]

## 2015-11-12 ENCOUNTER — Encounter: Payer: Self-pay | Admitting: Neurology

## 2015-11-21 ENCOUNTER — Encounter: Payer: Self-pay | Admitting: Neurology

## 2015-11-22 ENCOUNTER — Encounter: Payer: Self-pay | Admitting: Neurology

## 2015-11-22 ENCOUNTER — Ambulatory Visit (INDEPENDENT_AMBULATORY_CARE_PROVIDER_SITE_OTHER): Payer: 59 | Admitting: Neurology

## 2015-11-22 VITALS — BP 106/70 | HR 66 | Resp 14 | Ht 62.0 in | Wt 105.0 lb

## 2015-11-22 DIAGNOSIS — G35 Multiple sclerosis: Secondary | ICD-10-CM | POA: Diagnosis not present

## 2015-11-22 DIAGNOSIS — R260 Ataxic gait: Secondary | ICD-10-CM

## 2015-11-22 DIAGNOSIS — G40209 Localization-related (focal) (partial) symptomatic epilepsy and epileptic syndromes with complex partial seizures, not intractable, without status epilepticus: Secondary | ICD-10-CM | POA: Diagnosis not present

## 2015-11-22 DIAGNOSIS — R5383 Other fatigue: Secondary | ICD-10-CM | POA: Diagnosis not present

## 2015-11-22 DIAGNOSIS — M542 Cervicalgia: Secondary | ICD-10-CM | POA: Diagnosis not present

## 2015-11-22 MED ORDER — AMPHETAMINE-DEXTROAMPHET ER 30 MG PO CP24: 30.0000 mg | ORAL_CAPSULE | Freq: Every day | ORAL | 0 refills | Status: DC

## 2015-11-22 NOTE — Progress Notes (Signed)
Marland Kitchen   [facility]  PATIENT: Paula Anderson DOB: [birth year]  REFERRING DOCTOR OR PCP:  Delbert Harness    Fax [phone] ______________________________   HISTORICAL  CHIEF COMPLAINT:  Chief Complaint  Patient presents with  . Multiple Sclerosis    Sts. she continues to tolerate Copaxone well.  She is here today with c/o increased difficulty with gait with prolonged walking.  Sts. right leg is getting fatigued easier and then she drags her right foot. Onset one month ago.  Sts. she is also having more trouble with handwriting/fim    HISTORY OF PRESENT ILLNESS:  Paula Anderson is a 40 yo woman with  multiple sclerosis and seizures.    She fell yesterday and has noted gait has worsened.    Gait/strength/sensation: She feels that the right leg is dragging more and she catches the foot on the floor when she walks.     She stumbles daily and is falling every week or two.    She often needs to grab on to the wall or furniture for balance .    She has tingling in her legs but no more pronounced numbness.     MS:   She is on Copaxone and she tolerates it well.  She has not noted definite exacerbations thaough her gait has worsened some in past year.    Seizures:  Her last seizure was in early 2017 with  transient LOC with a glazed over look and unresponsiveness for 20-40 seconds.      She has had no seizures since she switched to Keppra and lamotrigine.     Vimpat did not help her in the past.    She had an EEG in [place] that reportedly had spikes but we do not have those results at this time.    Bladder/bowel:  She notes needing to urinate x 10 most days and x 1 most nights.   This is more than last year.      Vision:    She feels vision is very reduced on her left.     She notes asymmetry with brightness and color.       Fatigue/sleep:  She has physical more than mental fatigue.  Adderall XR 30 has helped.    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  Ambien caused a hangover effect the next day.     Mood/cognition:   She denies depression but notes irritability   She denies anxiety.    She has noted difficulty with cognition, especially decreased short-term memory, decreased ability to come up with the right word and decreased focus.     MS History:   She presented with difficulties with speech, memory and gait balance in 2013. MRIs were consistent with MS.   In 2014, she had a severe episode of vertigo and was taken to the hospital. Repeat MRI imaging was performed and she was diagnosed officially with MS. She was started on Copaxone.   She received IV steroids couple times when she had severe fatigue.     She tolerates Copaxone well.    No difficulty with skin reactions.  I personally reviewed MRI images of the brain and spine.   In the posterior fossa there is a right middle cerebellar peduncle lesion, possible medulla focus and left midbrain focus and two small cerebellar hemisphere foci. She has thalamic foci.  There are multiple periventricular, deep and juxtacortical white matter foci in the hemispheres. Some of these  are oriented to the ventricles. Some are hypointense on T1-weighted images. None enhance.  C- Spine without plaques.    REVIEW OF SYSTEMS: Constitutional: No fevers, chills, sweats, or change in appetite.   She reports fatigue and insomnia. Eyes: as above Ear, nose and throat: No hearing loss, ear pain, nasal congestion, sore throat.   She has vertigo. Cardiovascular: No chest pain, palpitations Respiratory: No shortness of breath at rest or with exertion.   No wheezes GastrointestinaI: No nausea, vomiting, diarrhea, abdominal pain, fecal incontinence Genitourinary: No dysuria, urinary retention or frequency.  No nocturia. Musculoskeletal: No neck pain.  She  notesback pain Integumentary: No rash, pruritus, skin lesions Neurological: as above Psychiatric: No depression at this time.  No anxiety Endocrine: No  palpitations, diaphoresis, change in appetite, change in weigh or increased thirst Hematologic/Lymphatic: No anemia, purpura, petechiae.   She feels that she bruises easily. Allergic/Immunologic: No itchy/runny eyes, nasal congestion, recent allergic reactions, rashes  ALLERGIES: Allergies  Allergen Reactions  . Amoxicillin Shortness Of Breath  . Penicillins Shortness Of Breath    Skin turns red  . Bupropion Other (See Comments)    Mood changes    HOME MEDICATIONS:  Current Outpatient Prescriptions:  .  acetaminophen-codeine (TYLENOL #3) 300-30 MG per tablet, Take 1 tablet by mouth every 8 (eight) hours as needed for moderate pain., Disp: 45 tablet, Rfl: 1 .  amantadine (SYMMETREL) 100 MG capsule, Take 1 capsule (100 mg total) by mouth 2 (two) times daily., Disp: 60 capsule, Rfl: 11 .  amphetamine-dextroamphetamine (ADDERALL XR) 30 MG 24 hr capsule, Take 1 capsule (30 mg total) by mouth daily., Disp: 30 capsule, Rfl: 0 .  cholecalciferol (VITAMIN D) 1000 UNITS tablet, Take 1,000 Units by mouth daily., Disp: , Rfl:  .  COPAXONE 40 MG/ML SOSY, Inject 40 mg into the skin 3 (three) times a week., Disp: 36 Syringe, Rfl: 3 .  doxepin (SINEQUAN) 10 MG capsule, Take 1 capsule (10 mg total) by mouth at bedtime., Disp: 30 capsule, Rfl: 5 .  Fluticasone Furoate-Vilanterol (BREO ELLIPTA) 100-25 MCG/INH AEPB, Inhale into the lungs., Disp: , Rfl:  .  imipramine (TOFRANIL) 25 MG tablet, Take one or two at bedtime, Disp: 60 tablet, Rfl: 3 .  lamoTRIgine (LAMICTAL) 100 MG tablet, Take 3 tablets (300 mg total) by mouth 2 (two) times daily., Disp: 180 tablet, Rfl: 11 .  levETIRAcetam (KEPPRA) 500 MG tablet, Take 1 tablet (500 mg total) by mouth 2 (two) times daily., Disp: 60 tablet, Rfl: 11 .  meloxicam (MOBIC) 15 MG tablet, Take 1 tablet (15 mg total) by mouth daily., Disp: 30 tablet, Rfl: 0 .  ondansetron (ZOFRAN) 8 MG tablet, Take 1 tablet (8 mg total) by mouth every 8 (eight) hours as needed for nausea  or vomiting., Disp: 12 tablet, Rfl: 3 .  VITAMIN B1-B12 IM, Inject 1,000 mcg into the muscle every 30 (thirty) days., Disp: , Rfl:  .  methylPREDNISolone (MEDROL DOSEPAK) 4 MG TBPK tablet, Take 6 tablets on day 1, 5 tablets on day 2, 4 tablets on day 3, 3 tablets on day 4, 2 tablets on day 5, and 1 tablet on day 6. (Patient not taking: Reported on 11/22/2015), Disp: 21 tablet, Rfl: 0 .  predniSONE (DELTASONE) 50 MG tablet, Take 10 po x 2 days, then 6 po x 1d, then 4 po x 1d, then 2 po x 1d then 1 po x 1 day for MS exacerbation (Patient not taking: Reported on 11/22/2015), Disp: 33 tablet, Rfl: 0 .  sulfamethoxazole-trimethoprim (BACTRIM DS,SEPTRA DS) 800-160 MG tablet, Take 1 tablet by mouth 2 (two) times daily. (Patient not taking: Reported on 11/22/2015), Disp: 14 tablet, Rfl: 0 No current facility-administered medications for this visit.   Facility-Administered Medications Ordered in Other Visits:  .  gadopentetate dimeglumine (MAGNEVIST) injection 12 mL, 12 mL, Intravenous, Once PRN, Asa Lente, MD  PAST MEDICAL HISTORY: Past Medical History:  Diagnosis Date  . Chronic headaches   . Multiple sclerosis (HCC)   . Seizures (HCC)     PAST SURGICAL HISTORY: Past Surgical History:  Procedure Laterality Date  . ABDOMINAL SURGERY    . COSMETIC SURGERY      FAMILY HISTORY: Family History  Problem Relation Age of Onset  . Cancer Father     SOCIAL HISTORY:  Social History   Social History  . Marital status: Married    Spouse name: Richard  . Number of children: 2  . Years of education: 12   Occupational History  . unemployeed    Social History Main Topics  . Smoking status: Former Games developer  . Smokeless tobacco: Never Used     Comment: quit 2010  . Alcohol use No  . Drug use: No  . Sexual activity: Yes    Partners: Male   Other Topics Concern  . Not on file   Social History Narrative   Lives at home with husband and children   Drinks caffeine: 2 cups a day      PHYSICAL EXAM  Vitals:   11/22/15 1331  BP: 106/70  Pulse: 66  Resp: 14  Weight: 105 lb (47.6 kg)  Height: 5\' 2"  (1.575 m)    Body mass index is 19.2 kg/m.   General: The patient is well-developed and well-nourished and in no acute distress  Neck:    Neck with good ROM and nontender.   Temporsal region nontender  Musculoskeletal:   Good ROM in arms/shoulders and shoulders are nontender.      Neurologic Exam  Mental status: The patient is alert and oriented x 3 at the time of the examination. The patient has apparent normal recent and remote memory, with an apparently normal attention span and concentration ability.   Speech is normal.  Cranial nerves: Extraocular movements show left nystagmus on left gaze. Decreased color saturation OS Facial symmetry is present. There is good facial sensation to soft touch bilaterally.Facial strength is normal.  Trapezius and sternocleidomastoid strength is normal. No dysarthria is noted.  The tongue is midline, and the patient has symmetric elevation of the soft palate. No obvious hearing deficits are noted.  Motor:  Muscle bulk is normal.   Tone is normal. Strength is  5 / 5 in all 4 extremities.   Sensory: Sensory testing shows mild reduced temperature sensation on the right arm/leg.   More symmetric vibration.     Coordination: Cerebellar testing reveals good finger-nose-finger and heel-to-shin bilaterally.  Gait and station: Station is normal.   Gait is mildly wide. Tandem gait is wide. Romberg is negative.   Reflexes: Deep tendon reflexes are symmetric and increased (spread at knees) bilaterally.      DIAGNOSTIC DATA (LABS, IMAGING, TESTING) - I reviewed patient records, labs, notes, testing and imaging myself where available.     ASSESSMENT AND PLAN  Multiple sclerosis (HCC) - Plan: MR Brain W Wo Contrast  Partial epilepsy with impairment of consciousness (HCC) - Plan: MR Brain W Wo Contrast  Ataxic gait - Plan: MR  Brain W Wo Contrast  Other fatigue  Neck pain   1.    Continue Keppra and lamotrigine for seizures.   Continue Copaxone for MS.  check MRI of the brain with and without contrast to make sure that there is not subclinical progression. If this is occurring, we will need to consider a more efficacious medication. 2.   continue Adderall XR 30 mg.,    4.    continue to stay active and exercises as tolerated.  5 .   She will return to see me in 3 months or sooner if she has new or worsening neurologic symptoms     Richard A. Epimenio FootSater, MD, PhD 11/22/2015, 2:12 PM Certified in Neurology, Clinical Neurophysiology, Sleep Medicine, Pain Medicine and Neuroimaging  [facility] [address], [address] [place], [place] [id number] [phone]

## 2015-11-29 ENCOUNTER — Encounter: Payer: Self-pay | Admitting: Neurology

## 2015-12-05 ENCOUNTER — Encounter: Payer: Self-pay | Admitting: Neurology

## 2015-12-10 ENCOUNTER — Encounter: Payer: Self-pay | Admitting: Neurology

## 2015-12-11 ENCOUNTER — Telehealth: Payer: Self-pay | Admitting: Neurology

## 2015-12-11 NOTE — Telephone Encounter (Signed)
Patient called, states she has sent several messages through Mychart with no response, please call [phone].

## 2015-12-11 NOTE — Telephone Encounter (Signed)
One my chart message came in at 9:43pm on 12-05-15 (Thursday).  I was out of the office on Friday.  Response sent via my chart/fim

## 2015-12-19 DIAGNOSIS — R928 Other abnormal and inconclusive findings on diagnostic imaging of breast: Secondary | ICD-10-CM | POA: Insufficient documentation

## 2015-12-19 DIAGNOSIS — Z9882 Breast implant status: Secondary | ICD-10-CM | POA: Insufficient documentation

## 2015-12-20 ENCOUNTER — Encounter: Payer: Self-pay | Admitting: Neurology

## 2016-01-07 DIAGNOSIS — N63 Unspecified lump in unspecified breast: Secondary | ICD-10-CM | POA: Insufficient documentation

## 2016-01-08 ENCOUNTER — Encounter: Payer: Self-pay | Admitting: Neurology

## 2016-01-09 ENCOUNTER — Telehealth: Payer: Self-pay | Admitting: Neurology

## 2016-01-09 NOTE — Telephone Encounter (Signed)
Pt called said "I just rec'd a VM from Faith and she said if nothing is wrong I can c/a the appt on Monday". I told her I see an email and a response but nothing about c/a your appt. She again repeated herself, I said ok and c/a the appt. I skyped Faith, she advised she did not speak with the pt.  FYI

## 2016-01-15 ENCOUNTER — Other Ambulatory Visit: Payer: Self-pay | Admitting: Neurology

## 2016-01-15 ENCOUNTER — Ambulatory Visit: Payer: 59 | Admitting: Neurology

## 2016-01-15 ENCOUNTER — Encounter: Payer: Self-pay | Admitting: Neurology

## 2016-01-16 ENCOUNTER — Other Ambulatory Visit: Payer: Self-pay | Admitting: *Deleted

## 2016-01-16 MED ORDER — AMPHETAMINE-DEXTROAMPHET ER 30 MG PO CP24: 30.0000 mg | ORAL_CAPSULE | Freq: Every day | ORAL | 0 refills | Status: DC

## 2016-01-18 ENCOUNTER — Encounter: Payer: Self-pay | Admitting: Neurology

## 2016-01-18 ENCOUNTER — Other Ambulatory Visit: Payer: Self-pay | Admitting: *Deleted

## 2016-01-18 DIAGNOSIS — R202 Paresthesia of skin: Secondary | ICD-10-CM

## 2016-01-18 DIAGNOSIS — R200 Anesthesia of skin: Secondary | ICD-10-CM

## 2016-01-18 DIAGNOSIS — G35 Multiple sclerosis: Secondary | ICD-10-CM

## 2016-01-18 DIAGNOSIS — R269 Unspecified abnormalities of gait and mobility: Secondary | ICD-10-CM

## 2016-01-27 ENCOUNTER — Other Ambulatory Visit: Payer: 59

## 2016-02-03 ENCOUNTER — Ambulatory Visit
Admission: RE | Admit: 2016-02-03 | Discharge: 2016-02-03 | Disposition: A | Discharge status: Home / Self Care | Payer: 59 | Source: Ambulatory Visit | Attending: Neurology | Admitting: Neurology

## 2016-02-03 DIAGNOSIS — G35 Multiple sclerosis: Secondary | ICD-10-CM

## 2016-02-03 DIAGNOSIS — R202 Paresthesia of skin: Secondary | ICD-10-CM

## 2016-02-03 DIAGNOSIS — R260 Ataxic gait: Secondary | ICD-10-CM | POA: Diagnosis not present

## 2016-02-03 DIAGNOSIS — G40209 Localization-related (focal) (partial) symptomatic epilepsy and epileptic syndromes with complex partial seizures, not intractable, without status epilepticus: Secondary | ICD-10-CM

## 2016-02-03 DIAGNOSIS — R269 Unspecified abnormalities of gait and mobility: Secondary | ICD-10-CM

## 2016-02-03 DIAGNOSIS — R200 Anesthesia of skin: Secondary | ICD-10-CM

## 2016-02-03 MED ORDER — GADOBENATE DIMEGLUMINE 529 MG/ML IV SOLN
9.0000 mL | Freq: Once | INTRAVENOUS | Status: AC | PRN
  Administered 2016-02-03: 9 mL via INTRAVENOUS

## 2016-02-05 ENCOUNTER — Telehealth: Payer: Self-pay | Admitting: *Deleted

## 2016-02-05 ENCOUNTER — Encounter: Payer: Self-pay | Admitting: Neurology

## 2016-02-05 NOTE — Telephone Encounter (Signed)
-----   Message from Asa Lente, MD sent at 02/04/2016 11:32 AM EST ----- Please let her know that the MRI of the brain and cervical spine are unchanged compared to 2016.

## 2016-02-05 NOTE — Telephone Encounter (Signed)
Results sent via My Chart/fim 

## 2016-02-09 ENCOUNTER — Other Ambulatory Visit: Payer: Self-pay | Admitting: Neurology

## 2016-02-13 ENCOUNTER — Other Ambulatory Visit: Payer: Self-pay | Admitting: Neurology

## 2016-02-14 MED ORDER — AMPHETAMINE-DEXTROAMPHET ER 30 MG PO CP24: 30.0000 mg | ORAL_CAPSULE | Freq: Every day | ORAL | 0 refills | Status: DC

## 2016-02-14 MED ORDER — MELOXICAM 15 MG PO TABS: 15.0000 mg | ORAL_TABLET | Freq: Every day | ORAL | 0 refills | Status: DC

## 2016-02-14 NOTE — Telephone Encounter (Signed)
Prescription printed, on Dr Bonnita Hollow desk for sig.

## 2016-03-01 ENCOUNTER — Encounter: Payer: Self-pay | Admitting: Neurology

## 2016-03-26 ENCOUNTER — Ambulatory Visit (INDEPENDENT_AMBULATORY_CARE_PROVIDER_SITE_OTHER): Payer: 59 | Admitting: Neurology

## 2016-03-26 ENCOUNTER — Encounter: Payer: Self-pay | Admitting: Neurology

## 2016-03-26 VITALS — BP 100/60 | HR 72 | Resp 14 | Ht 62.0 in | Wt 103.0 lb

## 2016-03-26 DIAGNOSIS — R260 Ataxic gait: Secondary | ICD-10-CM | POA: Diagnosis not present

## 2016-03-26 DIAGNOSIS — R5383 Other fatigue: Secondary | ICD-10-CM

## 2016-03-26 DIAGNOSIS — G40209 Localization-related (focal) (partial) symptomatic epilepsy and epileptic syndromes with complex partial seizures, not intractable, without status epilepticus: Secondary | ICD-10-CM

## 2016-03-26 DIAGNOSIS — G35 Multiple sclerosis: Secondary | ICD-10-CM | POA: Diagnosis not present

## 2016-03-26 DIAGNOSIS — G4721 Circadian rhythm sleep disorder, delayed sleep phase type: Secondary | ICD-10-CM | POA: Insufficient documentation

## 2016-03-26 MED ORDER — AMPHETAMINE-DEXTROAMPHET ER 30 MG PO CP24: 30.0000 mg | ORAL_CAPSULE | Freq: Every day | ORAL | 0 refills | Status: DC

## 2016-03-26 MED ORDER — TEMAZEPAM 15 MG PO CAPS: 15.0000 mg | ORAL_CAPSULE | Freq: Every evening | ORAL | 3 refills | Status: DC | PRN

## 2016-03-26 MED ORDER — LEVETIRACETAM 1000 MG PO TABS: 1000.0000 mg | ORAL_TABLET | Freq: Two times a day (BID) | ORAL | 11 refills | Status: DC
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
## 2016-03-26 NOTE — Patient Instructions (Signed)
Do not drive until 6 months after the last seizure. This is according to the [facility].   Increase the Keppra to 1000 mg twice a day. Continue lamotrigine at the current dose.  We need to try to move your bedtime several hours earlier so you can get more sleep. More sleep will help to reduce seizures.  Set a bedtime earlier take melatonin 5 mg. About 15 minutes before the bedtime, take your temazepam  After several days, try to move the bedtime 15 minutes earlier. Continue to move the bedtime every few days by 15 minutes earlier until you are going to bed at midnight. At that time, you will be taking a melatonin at 9:30-10 PM and the temazepam at 15 till midnight.

## 2016-03-29 ENCOUNTER — Other Ambulatory Visit: Payer: Self-pay | Admitting: Family Medicine

## 2016-03-29 DIAGNOSIS — N6320 Unspecified lump in the left breast, unspecified quadrant: Secondary | ICD-10-CM

## 2016-05-02 ENCOUNTER — Encounter: Payer: Self-pay | Admitting: Neurology

## 2016-05-09 ENCOUNTER — Telehealth: Payer: Self-pay | Admitting: *Deleted

## 2016-05-09 MED ORDER — COPAXONE 40 MG/ML ~~LOC~~ SOSY: 40.0000 mg | PREFILLED_SYRINGE | SUBCUTANEOUS | 3 refills | Status: DC

## 2016-05-09 NOTE — Telephone Encounter (Signed)
Copaxone escribed to [facility] per faxed request/fim

## 2016-05-23 ENCOUNTER — Other Ambulatory Visit: Payer: Self-pay | Admitting: Neurology

## 2016-05-23 ENCOUNTER — Encounter: Payer: Self-pay | Admitting: Neurology

## 2016-05-24 ENCOUNTER — Encounter: Payer: Self-pay | Admitting: Neurology

## 2016-05-24 MED ORDER — AMPHETAMINE-DEXTROAMPHET ER 30 MG PO CP24
30.0000 mg | ORAL_CAPSULE | Freq: Every day | ORAL | 0 refills | Status: DC
Start: 2016-05-24 — End: 2016-06-26

## 2016-05-24 MED ORDER — MELOXICAM 15 MG PO TABS: 15.0000 mg | ORAL_TABLET | Freq: Every day | ORAL | 11 refills | Status: DC

## 2016-05-24 NOTE — Telephone Encounter (Signed)
Mobic rx e-scribed to pharmacy. Adderall printed, awaiting MD signature.

## 2016-05-27 ENCOUNTER — Encounter: Payer: Self-pay | Admitting: Neurology

## 2016-06-10 DIAGNOSIS — L608 Other nail disorders: Secondary | ICD-10-CM | POA: Insufficient documentation

## 2016-06-21 ENCOUNTER — Other Ambulatory Visit: Payer: Self-pay | Admitting: Family Medicine

## 2016-06-21 ENCOUNTER — Ambulatory Visit
Admission: RE | Admit: 2016-06-21 | Discharge: 2016-06-21 | Disposition: A | Discharge status: Home / Self Care | Payer: 59 | Source: Ambulatory Visit | Attending: Family Medicine | Admitting: Family Medicine

## 2016-06-21 DIAGNOSIS — N6320 Unspecified lump in the left breast, unspecified quadrant: Secondary | ICD-10-CM

## 2016-06-26 ENCOUNTER — Other Ambulatory Visit: Payer: Self-pay | Admitting: Neurology

## 2016-06-26 MED ORDER — AMPHETAMINE-DEXTROAMPHET ER 30 MG PO CP24: 30.0000 mg | ORAL_CAPSULE | Freq: Every day | ORAL | 0 refills | Status: DC

## 2016-06-27 ENCOUNTER — Encounter: Payer: Self-pay | Admitting: Neurology

## 2016-07-01 ENCOUNTER — Telehealth: Payer: Self-pay | Admitting: *Deleted

## 2016-07-01 ENCOUNTER — Telehealth: Payer: Self-pay | Admitting: Neurology

## 2016-07-01 MED ORDER — AMPHETAMINE-DEXTROAMPHET ER 20 MG PO CP24: 20.0000 mg | ORAL_CAPSULE | Freq: Two times a day (BID) | ORAL | 0 refills | Status: DC

## 2016-07-01 NOTE — Telephone Encounter (Signed)
See pt. email.  Pt. still c/o fatigue despite Adderall XR 30mg  once daily.  Per RAS, ok for Adderall XR 20mg  bid. Rx. up front [facility]/fim

## 2016-07-01 NOTE — Telephone Encounter (Signed)
LMOM that there is no appt. earlier than 6/7 at this time, but I will call her if one becomes available/fim

## 2016-07-01 NOTE — Telephone Encounter (Signed)
Pt. opted not to try Adderall XR 20mg  bid, accepted rx for Adderall XR 30mg  daily.  Adderall 20mg  rx. that was printed this morning has been destroyed/fim

## 2016-07-25 ENCOUNTER — Encounter: Payer: Self-pay | Admitting: Neurology

## 2016-07-25 ENCOUNTER — Ambulatory Visit: Payer: 59 | Admitting: Neurology

## 2016-07-26 ENCOUNTER — Encounter: Payer: Self-pay | Admitting: Neurology

## 2016-07-30 ENCOUNTER — Encounter: Payer: Self-pay | Admitting: Neurology

## 2016-08-02 ENCOUNTER — Encounter: Payer: Self-pay | Admitting: Neurology

## 2016-08-05 ENCOUNTER — Telehealth: Payer: Self-pay | Admitting: *Deleted

## 2016-08-05 MED ORDER — AMPHETAMINE-DEXTROAMPHET ER 20 MG PO CP24: 20.0000 mg | ORAL_CAPSULE | Freq: Two times a day (BID) | ORAL | 0 refills | Status: DC

## 2016-08-05 NOTE — Telephone Encounter (Signed)
Adderall rx. up front [facility]/fim 

## 2016-08-12 NOTE — Telephone Encounter (Signed)
I have spoken with Paula Anderson today. She sts. she picked up #6 tablets of last rx. b/c rx. needs a PA.  I have spoken with the pharmacist who sts. PA/rest of the rx. is cancelled with a partial fill, that PA will need to be done with next rx.  Pt. aware/fim

## 2016-08-12 NOTE — Telephone Encounter (Signed)
Patient called office in reference to scheduling a fu visit with Dr. Epimenio Foot patient has been scheduled for 10/17/16 (first available) are we able to work patient in sooner so she can continue to receive refills.  Please call

## 2016-08-12 NOTE — Telephone Encounter (Signed)
LMTC./fim 

## 2016-08-12 NOTE — Telephone Encounter (Signed)
Patient returning your call.

## 2016-08-12 NOTE — Telephone Encounter (Signed)
Patient called office returning RN's call.  Please call °

## 2016-08-12 NOTE — Telephone Encounter (Signed)
Patient calling to discuss Rx Adderall that she picked up. Pharmacy says it is a different dosage.

## 2016-08-13 ENCOUNTER — Encounter: Payer: Self-pay | Admitting: Neurology

## 2016-08-13 DIAGNOSIS — F329 Major depressive disorder, single episode, unspecified: Secondary | ICD-10-CM | POA: Insufficient documentation

## 2016-08-13 DIAGNOSIS — F419 Anxiety disorder, unspecified: Secondary | ICD-10-CM

## 2016-08-13 MED ORDER — AMPHETAMINE-DEXTROAMPHET ER 20 MG PO CP24: 20.0000 mg | ORAL_CAPSULE | Freq: Two times a day (BID) | ORAL | 0 refills | Status: DC

## 2016-08-13 NOTE — Telephone Encounter (Signed)
Rx. awaiting RAS sig/fim 

## 2016-08-13 NOTE — Telephone Encounter (Signed)
I have spoken with Paula Anderson this afternoon.  Adderall XR rx. up front for her to pick up. She is aware that when she takes it to the pharmacy, it is likely going to need a PA.  She should not get a partial fill--as this cancels out the rest of the rx. and pa request (per pharmacist). If a PA is needed, I will complete it asap/fim

## 2016-08-13 NOTE — Addendum Note (Signed)
Addended by: Candis Schatz I on: 08/13/2016 02:53 PM   Modules accepted: Orders

## 2016-08-16 ENCOUNTER — Telehealth: Payer: Self-pay | Admitting: *Deleted

## 2016-08-16 NOTE — Telephone Encounter (Signed)
PA  (QLE) for Adderall XR 20mg  po bid completed by phone with [facility] (phone# [phone]).  Approved thru 08/16/17.  PA# [id number].  Tried and faileds are Amantadine 100mg  bid, Adderall XR 20mg  QD, Adderall XR 30mg  QD, Adderall 20mg  bid, and Modafinil 200mg  QD/fim

## 2016-09-10 ENCOUNTER — Encounter: Payer: Self-pay | Admitting: Neurology

## 2016-09-16 ENCOUNTER — Encounter: Payer: Self-pay | Admitting: Neurology

## 2016-09-16 ENCOUNTER — Ambulatory Visit (INDEPENDENT_AMBULATORY_CARE_PROVIDER_SITE_OTHER): Payer: 59 | Admitting: Neurology

## 2016-09-16 VITALS — BP 106/62 | HR 74 | Resp 14 | Ht 62.0 in | Wt 107.0 lb

## 2016-09-16 DIAGNOSIS — G40209 Localization-related (focal) (partial) symptomatic epilepsy and epileptic syndromes with complex partial seizures, not intractable, without status epilepticus: Secondary | ICD-10-CM

## 2016-09-16 DIAGNOSIS — R260 Ataxic gait: Secondary | ICD-10-CM

## 2016-09-16 DIAGNOSIS — H532 Diplopia: Secondary | ICD-10-CM

## 2016-09-16 DIAGNOSIS — R5383 Other fatigue: Secondary | ICD-10-CM | POA: Diagnosis not present

## 2016-09-16 DIAGNOSIS — G35 Multiple sclerosis: Secondary | ICD-10-CM

## 2016-09-16 MED ORDER — AMPHETAMINE-DEXTROAMPHET ER 20 MG PO CP24: 20.0000 mg | ORAL_CAPSULE | Freq: Two times a day (BID) | ORAL | 0 refills | Status: DC

## 2016-09-16 MED ORDER — PREDNISONE 50 MG PO TABS: ORAL_TABLET | ORAL | 0 refills | Status: DC

## 2016-09-16 NOTE — Progress Notes (Signed)
Marland Kitchen   [facility]  PATIENT: Paula Anderson DOB: 05/26/1975  REFERRING DOCTOR OR PCP:  Delbert Harness    Fax [phone] ______________________________   HISTORICAL  CHIEF COMPLAINT:  Chief Complaint  Patient presents with  . Multiple Sclerosis    Sts. she continues to tolerate Copaxone well.  Today she c/o difficulty with fine motor skills both hands, first noted when trying to tie her shoes a little over a wk. ago.  Also c/o blurry/ ? double vision right eye onset 1-2 days ago.  Denies eye pain/fim    HISTORY OF PRESENT ILLNESS:  Paula Anderson is a 41 yo woman with  multiple sclerosis and seizures.    MS:   She is on copaxone and she tolerates it well.    Last week, she had the onset of bilateral hand clumsiness.   No major change in strength or sensation.   She needed help to tie her shoes.   The gait seems slightly worse.    Gait/strength/sensation: Gait is mildly wide and slightly off balanced.    She feels people look at her when she walks and someone once thought she was drunk.   The right leg is dragging more and she catches the foot on the floor when she walks.     She stumbles daily and is falling every week or two.    She often needs to grab on to the wall or furniture for balance .    She has tingling in her legs but no more pronounced numbness.     Bladder/bowel:  She notes needing to urinate x 10 most days and x 1 most nights.   This is more than last year.      Vision: She has diplopia to left gaze (old) but now notes it when she looks right (new this week)   She feels vision is very reduced on her left.     She notes asymmetry with brightness and color.       Fatigue/sleep:  She has physical and mental fatigue.  Adderall XR 30 has helped.    She has insomnia and delayed phase sllep disorder (she goes to bed around 3 am and feels she can't go to bed earlier.)   Ambien caused a hangover effect the next day.     Mood/cognition:  Mood is doing well. She denies  depression or anxiety. She does have some irritability and gets angry easily at times.     She has noted difficulty with short term memory, decreased ability to come up with the right word and decreased focus.     Seizure:  No seizures since her last visit. Earlier this year, she had a seizure lasting 5 minutes. Her previous seizure was at least a year earlier she is on Keppra plus lamotrigine.      She had an EEG in [place] that reportedly had spikes but we do not have those results at this time.       MS History:   She presented with difficulties with speech, memory and gait balance in 2013. MRIs were consistent with MS.   In 2014, she had a severe episode of vertigo and was taken to the hospital. Repeat MRI imaging was performed and she was diagnosed officially with MS. She was started on Copaxone.   She received IV steroids couple times when she had severe fatigue.     She tolerates Copaxone well.    No difficulty with skin reactions.  I personally  reviewed MRI images of the brain and spine.   In the posterior fossa there is a right middle cerebellar peduncle lesion, possible medulla focus and left midbrain focus and two small cerebellar hemisphere foci. She has thalamic foci.  There are multiple periventricular, deep and juxtacortical white matter foci in the hemispheres. Some of these are oriented to the ventricles. Some are hypointense on T1-weighted images. None enhance.  C- Spine without plaques.    REVIEW OF SYSTEMS: Constitutional: No fevers, chills, sweats, or change in appetite.   She reports fatigue and insomnia. Eyes: as above Ear, nose and throat: No hearing loss, ear pain, nasal congestion, sore throat.   She has vertigo. Cardiovascular: No chest pain, palpitations Respiratory: No shortness of breath at rest or with exertion.   No wheezes GastrointestinaI: No nausea, vomiting, diarrhea, abdominal pain, fecal incontinence Genitourinary: No dysuria, urinary retention or frequency.  No  nocturia. Musculoskeletal: No neck pain.  She  notesback pain Integumentary: No rash, pruritus, skin lesions Neurological: as above Psychiatric: No depression at this time.  No anxiety Endocrine: No palpitations, diaphoresis, change in appetite, change in weigh or increased thirst Hematologic/Lymphatic: No anemia, purpura, petechiae.   She feels that she bruises easily. Allergic/Immunologic: No itchy/runny eyes, nasal congestion, recent allergic reactions, rashes  ALLERGIES: Allergies  Allergen Reactions  . Amoxicillin Shortness Of Breath  . Penicillins Shortness Of Breath    Skin turns red  . Bupropion Other (See Comments)    Mood changes    HOME MEDICATIONS:  Current Outpatient Prescriptions:  .  acetaminophen-codeine (TYLENOL #3) 300-30 MG per tablet, Take 1 tablet by mouth every 8 (eight) hours as needed for moderate pain., Disp: 45 tablet, Rfl: 1 .  amantadine (SYMMETREL) 100 MG capsule, Take 1 capsule (100 mg total) by mouth 2 (two) times daily., Disp: 60 capsule, Rfl: 11 .  amphetamine-dextroamphetamine (ADDERALL XR) 20 MG 24 hr capsule, Take 1 capsule (20 mg total) by mouth 2 (two) times daily., Disp: 60 capsule, Rfl: 0 .  cholecalciferol (VITAMIN D) 1000 UNITS tablet, Take 1,000 Units by mouth daily., Disp: , Rfl:  .  COPAXONE 40 MG/ML SOSY, Inject 40 mg into the skin 3 (three) times a week., Disp: 36 Syringe, Rfl: 3 .  doxepin (SINEQUAN) 10 MG capsule, Take 1 capsule (10 mg total) by mouth at bedtime., Disp: 30 capsule, Rfl: 5 .  Fluticasone Furoate-Vilanterol (BREO ELLIPTA) 100-25 MCG/INH AEPB, Inhale into the lungs., Disp: , Rfl:  .  imipramine (TOFRANIL) 25 MG tablet, Take one or two at bedtime, Disp: 60 tablet, Rfl: 3 .  lamoTRIgine (LAMICTAL) 100 MG tablet, Take 3 tablets (300 mg total) by mouth 2 (two) times daily., Disp: 180 tablet, Rfl: 11 .  levETIRAcetam (KEPPRA) 1000 MG tablet, Take 1 tablet (1,000 mg total) by mouth 2 (two) times daily., Disp: 60 tablet, Rfl:  11 .  meloxicam (MOBIC) 15 MG tablet, Take 1 tablet (15 mg total) by mouth daily., Disp: 30 tablet, Rfl: 11 .  methylPREDNISolone (MEDROL DOSEPAK) 4 MG TBPK tablet, Take 6 tablets on day 1, 5 tablets on day 2, 4 tablets on day 3, 3 tablets on day 4, 2 tablets on day 5, and 1 tablet on day 6., Disp: 21 tablet, Rfl: 0 .  ondansetron (ZOFRAN) 8 MG tablet, Take 1 tablet (8 mg total) by mouth every 8 (eight) hours as needed for nausea or vomiting., Disp: 12 tablet, Rfl: 3 .  predniSONE (DELTASONE) 50 MG tablet, Take 10 po x 2 days, then  6 po x 1d, then 4 po x 1d, then 2 po x 1d then 1 po x 1 day for MS exacerbation, Disp: 33 tablet, Rfl: 0 .  sulfamethoxazole-trimethoprim (BACTRIM DS,SEPTRA DS) 800-160 MG tablet, Take 1 tablet by mouth 2 (two) times daily., Disp: 14 tablet, Rfl: 0 .  temazepam (RESTORIL) 15 MG capsule, Take 1 capsule (15 mg total) by mouth at bedtime as needed for sleep., Disp: 30 capsule, Rfl: 3 .  VITAMIN B1-B12 IM, Inject 1,000 mcg into the muscle every 30 (thirty) days., Disp: , Rfl:  No current facility-administered medications for this visit.   Facility-Administered Medications Ordered in Other Visits:  .  gadopentetate dimeglumine (MAGNEVIST) injection 12 mL, 12 mL, Intravenous, Once PRN, Deondria Puryear, Pearletha Furl, MD  PAST MEDICAL HISTORY: Past Medical History:  Diagnosis Date  . Chronic headaches   . Multiple sclerosis (HCC)   . Seizures (HCC)     PAST SURGICAL HISTORY: Past Surgical History:  Procedure Laterality Date  . ABDOMINAL SURGERY    . COSMETIC SURGERY      FAMILY HISTORY: Family History  Problem Relation Age of Onset  . Cancer Father     SOCIAL HISTORY:  Social History   Social History  . Marital status: Married    Spouse name: Janissa Bertram  . Number of children: 2  . Years of education: 12   Occupational History  . unemployeed    Social History Main Topics  . Smoking status: Former Games developer  . Smokeless tobacco: Never Used     Comment: quit 2010  .  Alcohol use No  . Drug use: No  . Sexual activity: Yes    Partners: Male   Other Topics Concern  . Not on file   Social History Narrative   Lives at home with husband and children   Drinks caffeine: 2 cups a day     PHYSICAL EXAM  Vitals:   09/16/16 1434  BP: 106/62  Pulse: 74  Resp: 14  Weight: 107 lb (48.5 kg)  Height: 5\' 2"  (1.575 m)    Body mass index is 19.57 kg/m.   General: The patient is well-developed and well-nourished and in no acute distress      Neurologic Exam  Mental status: The patient is alert and oriented x 3 at the time of the examination. The patient has apparent normal recent and remote memory, with an apparently normal attention span and concentration ability.   Speech is normal.  Cranial nerves: Extraocular movements show left nystagmus on left gaze and mild right nystagmus on right gaze..  Facial symmetry is present. There is good facial sensation to soft touch bilaterally.Facial strength is normal.  Trapezius and sternocleidomastoid strength is normal. No dysarthria is noted.  The tongue is midline, and the patient has symmetric elevation of the soft palate. No obvious hearing deficits are noted.  Motor:  Muscle bulk is normal.   Tone is normal. Strength is  5 / 5 in all 4 extremities.   Sensory: Sensory testing shows mild reduced temperature sensation on the right arm/leg.   More symmetric vibration.     Coordination: Finger-nose-finger and heel to shin is performed reasonably well.. Rapid alternating movements are slightly slower in the right hand than the left.  Gait and station: Station is normal.   Gait is mildly wide with mild right foot drop. Tandem gait is wide. Romberg is negative.   Reflexes: Deep tendon reflexes are symmetric and increased (spread at knees) bilaterally.  DIAGNOSTIC DATA (LABS, IMAGING, TESTING) - I reviewed patient records, labs, notes, testing and imaging myself where available.     ASSESSMENT AND  PLAN  Multiple sclerosis (HCC)  Partial epilepsy with impairment of consciousness (HCC)  Ataxic gait  Diplopia  Other fatigue   1.    Continue the lamotrigine and levetiracetam.   2.    Continue Copaxone for MS for now. She appears to be having a small exacerbation and I will have her get 1 day of IV Solu-Medrol followed by a high dose prednisone taper. We discussed that if she has another exacerbation or if her next MRI shows subclinical progression of the MS that we would need to reconsider her treatment. 3.   continue Adderall XR 20 mg bid,    4.  5.    continue to stay active and exercises as tolerated.  6 .   She will return to see me in 4 months or sooner if she has new or worsening neurologic symptoms     Marquite Attwood A. Epimenio Foot, MD, PhD 09/16/2016, 3:32 PM Certified in Neurology, Clinical Neurophysiology, Sleep Medicine, Pain Medicine and Neuroimaging  [facility] [address] [place], [place] [address] [phone]

## 2016-10-14 ENCOUNTER — Encounter: Payer: Self-pay | Admitting: Neurology

## 2016-10-14 DIAGNOSIS — R350 Frequency of micturition: Secondary | ICD-10-CM | POA: Insufficient documentation

## 2016-10-16 ENCOUNTER — Telehealth: Payer: Self-pay | Admitting: *Deleted

## 2016-10-16 MED ORDER — TEMAZEPAM 15 MG PO CAPS: 15.0000 mg | ORAL_CAPSULE | Freq: Every evening | ORAL | 3 refills | Status: DC | PRN

## 2016-10-16 MED ORDER — AMPHETAMINE-DEXTROAMPHET ER 20 MG PO CP24: 20.0000 mg | ORAL_CAPSULE | Freq: Two times a day (BID) | ORAL | 0 refills | Status: DC

## 2016-10-16 NOTE — Addendum Note (Signed)
Addended by: Candis Schatz I on: 10/16/2016 11:43 AM   Modules accepted: Orders

## 2016-10-16 NOTE — Telephone Encounter (Signed)
Rx. faxed to [facility] per emailed request/fim

## 2016-10-16 NOTE — Telephone Encounter (Signed)
Adderall rx. up front [facility] per emailed request/fim 

## 2016-10-17 ENCOUNTER — Ambulatory Visit: Payer: 59 | Admitting: Neurology

## 2016-10-25 ENCOUNTER — Other Ambulatory Visit: Payer: 59

## 2016-11-06 ENCOUNTER — Telehealth: Payer: Self-pay | Admitting: Neurology

## 2016-11-06 ENCOUNTER — Other Ambulatory Visit: Payer: Self-pay | Admitting: Neurology

## 2016-11-06 MED ORDER — LAMOTRIGINE 100 MG PO TABS: 300.0000 mg | ORAL_TABLET | Freq: Two times a day (BID) | ORAL | 3 refills | Status: DC

## 2016-11-06 NOTE — Telephone Encounter (Signed)
Lamictal escribed to [facility]. Pt. aware this has been done/fim

## 2016-11-06 NOTE — Addendum Note (Signed)
Addended by: Candis Schatz I on: 11/06/2016 04:46 PM   Modules accepted: Orders

## 2016-11-06 NOTE — Telephone Encounter (Signed)
Pt calling for refill of lamoTRIgine (LAMICTAL) 100 MG tablet Pt states she was told by  [id number] - [place], [place] - [address] AT [facility] [address] [phone] (Phone) [phone] (Fax)   That the prescription has expired, pt is in need of a refill.  Pt is asking for a call back, she asked that it be documented that she has withdrawals from this medication

## 2016-11-07 ENCOUNTER — Other Ambulatory Visit: Payer: Self-pay | Admitting: Neurology

## 2016-11-19 ENCOUNTER — Encounter: Payer: Self-pay | Admitting: Neurology

## 2016-11-20 ENCOUNTER — Ambulatory Visit: Payer: Self-pay | Admitting: Neurology

## 2016-11-25 ENCOUNTER — Ambulatory Visit: Payer: Self-pay | Admitting: Neurology

## 2016-12-02 ENCOUNTER — Encounter: Payer: Self-pay | Admitting: Neurology

## 2016-12-04 ENCOUNTER — Telehealth: Payer: Self-pay | Admitting: *Deleted

## 2016-12-04 ENCOUNTER — Ambulatory Visit: Payer: Self-pay | Admitting: Neurology

## 2016-12-04 NOTE — Telephone Encounter (Signed)
LMOM for pt. to call.  She has an appt. with RAS this afternoon, but he has had to leave the office suddenly.  I need to r/s this appt. for her/fim

## 2016-12-08 ENCOUNTER — Encounter: Payer: Self-pay | Admitting: Neurology

## 2016-12-09 ENCOUNTER — Telehealth: Payer: Self-pay | Admitting: *Deleted

## 2016-12-09 MED ORDER — AMPHETAMINE-DEXTROAMPHET ER 20 MG PO CP24: 20.0000 mg | ORAL_CAPSULE | Freq: Two times a day (BID) | ORAL | 0 refills | Status: DC

## 2016-12-09 NOTE — Telephone Encounter (Signed)
Adderall rx. up front [facility], per emailed request/fim 

## 2016-12-16 ENCOUNTER — Encounter: Payer: Self-pay | Admitting: Neurology

## 2016-12-16 ENCOUNTER — Ambulatory Visit (INDEPENDENT_AMBULATORY_CARE_PROVIDER_SITE_OTHER): Payer: 59 | Admitting: Neurology

## 2016-12-16 VITALS — BP 123/72 | HR 90 | Resp 16 | Ht 62.0 in | Wt 113.5 lb

## 2016-12-16 DIAGNOSIS — H539 Unspecified visual disturbance: Secondary | ICD-10-CM

## 2016-12-16 DIAGNOSIS — R260 Ataxic gait: Secondary | ICD-10-CM | POA: Diagnosis not present

## 2016-12-16 DIAGNOSIS — G40209 Localization-related (focal) (partial) symptomatic epilepsy and epileptic syndromes with complex partial seizures, not intractable, without status epilepticus: Secondary | ICD-10-CM | POA: Diagnosis not present

## 2016-12-16 DIAGNOSIS — N390 Urinary tract infection, site not specified: Secondary | ICD-10-CM

## 2016-12-16 DIAGNOSIS — G35 Multiple sclerosis: Secondary | ICD-10-CM

## 2016-12-16 MED ORDER — NITROFURANTOIN MONOHYD MACRO 100 MG PO CAPS
100.0000 mg | ORAL_CAPSULE | Freq: Two times a day (BID) | ORAL | 0 refills | Status: DC
Start: 2016-12-16 — End: 2017-11-28

## 2016-12-16 MED ORDER — AMPHETAMINE-DEXTROAMPHET ER 20 MG PO CP24: 20.0000 mg | ORAL_CAPSULE | Freq: Two times a day (BID) | ORAL | 0 refills | Status: DC

## 2016-12-16 NOTE — Progress Notes (Addendum)
Marland Kitchen   [facility]  PATIENT: Paula Anderson DOB: [birth year]  REFERRING DOCTOR OR PCP:  Delbert Harness    Fax [phone] ______________________________   HISTORICAL  CHIEF COMPLAINT:  Chief Complaint  Patient presents with  . Multiple Sclerosis    Sts. she continues to tolerate Copaxone well.  Sts. balance is worse, vision in right eye is worse, difficulty writing--can't get right hand to work right. Sts. taste is altered--foods don't taste right.  PCP started Prozac for depression and she feels calmer, but not less sad; still feels anxious around alot of people. Sts. has tried Amitriptyline in the past but couldn't tolerate it/fim    HISTORY OF PRESENT ILLNESS:  Paula Anderson is a 41 yo woman with multiple sclerosis and seizures.    Update 12/16/2016:    Since her last visit, she feels her balance is worse.   She does not use a cane and can walk a mile if she needed to.   She has had a couple falls and hurt her arm the one time.    She has more right hand clumsiness and writing is worse.  She denies any new numbness or dysesthesia.   The only medication she has been on is Copaxone.   we discussed that with 2 exacerbations in the Rebif 6 months that I feel she needs to switch to a different medication and we discussed options.    She also feels the vision is worse in her right eye.    Usually the left eye is worse than the right but the right eye worsened 2 weeks ago.   Her left eye is still worse but right eye now almost as bad.     She has had several UTI which improved with Bactrim and then came back and was treated with Keflex and she thinks it may have come back.  The recurrent UTI was resistant to Bactrim by report (10/14/16) and she was placed on Keflex so we'll try another antibiotic.   She had had some depression earlier this year and feels better on Prozac.   she feels mood is fine now.  No suicidal ideation.  Cognition is baseline.     She has not had any seizures on  the new combination of Lamictal plus Keppra (added)  She has vertigo and has rarely taken Zofran but none in the past year.   Her last steroid was early August,  2018 after her last exacerbation.     Repeat Cervical spine MRI 02/03/2016 showed small non-nenhancing focus adjacent to C7T1 in the left posterolateral spinal cord.    Repeat Brain MRI 02/03/2016 is unchanged.     ________________________________________- From 09/16/2016:  MS:   She is on copaxone and she tolerates it well.    Last week, she had the onset of bilateral hand clumsiness.   No major change in strength or sensation.   She needed help to tie her shoes.   The gait seems slightly worse.    Gait/strength/sensation: Gait is mildly wide and slightly off balanced.    She feels people look at her when she walks and someone once thought she was drunk.   The right leg is dragging more and she catches the foot on the floor when she walks.     She stumbles daily and is falling every week or two.    She often needs to grab on to the wall or furniture for balance .    She has tingling in her  legs but no more pronounced numbness.     Bladder/bowel:  She notes needing to urinate x 10 most days and x 1 most nights.   This is more than last year.      Vision: She has diplopia to left gaze (old) but now notes it when she looks right (new this week)   She feels vision is very reduced on her left.     She notes asymmetry with brightness and color.       Fatigue/sleep:  She has physical and mental fatigue.  Adderall XR 30 has helped.    She has insomnia and delayed phase sllep disorder (she goes to bed around 3 am and feels she can't go to bed earlier.)   Ambien caused a hangover effect the next day.     Mood/cognition:  Mood is doing well. She denies depression or anxiety. She does have some irritability and gets angry easily at times.     She has noted difficulty with short term memory, decreased ability to come up with the right word and  decreased focus.     Seizure:  No seizures since her last visit. Earlier this year, she had a seizure lasting 5 minutes. Her previous seizure was at least a year earlier she is on Keppra plus lamotrigine.      She had an EEG in [place] that reportedly had spikes but we do not have those results at this time.       MS History:   She presented with difficulties with speech, memory and gait balance in 2013. MRIs were consistent with MS.   In 2014, she had a severe episode of vertigo and was taken to the hospital. Repeat MRI imaging was performed and she was diagnosed officially with MS. She was started on Copaxone.   She received IV steroids couple times when she had severe fatigue.     She tolerates Copaxone well.    No difficulty with skin reactions.  I personally reviewed MRI images of the brain and spine.   In the posterior fossa there is a right middle cerebellar peduncle lesion, possible medulla focus and left midbrain focus and two small cerebellar hemisphere foci. She has thalamic foci.  There are multiple periventricular, deep and juxtacortical white matter foci in the hemispheres. Some of these are oriented to the ventricles. Some are hypointense on T1-weighted images. None enhance.  C- Spine 10/19/2014 reported without plaques.  Repeat Brain MRI 02/03/2016 is unchanged.   Repeat Cervical spine MRI 02/03/2016 shows small no-nenhancing focus adjacent to C7T1 in the left posterolateral spinal cord  REVIEW OF SYSTEMS: Constitutional: No fevers, chills, sweats, or change in appetite.   She reports fatigue and insomnia. Eyes: as above Ear, nose and throat: No hearing loss, ear pain, nasal congestion, sore throat.   She has vertigo. Cardiovascular: No chest pain, palpitations Respiratory: No shortness of breath at rest or with exertion.   No wheezes GastrointestinaI: No nausea, vomiting, diarrhea, abdominal pain, fecal incontinence GenitourinaShe has had a couple UTIs.Musculoskeletal: No neck pain.   She  notesback pain Integumentary: No rash, pruritus, skin lesions Neurological: as above Psychiatric: Notes mild depression at this time.  No anxiety Endocrine: No palpitations, diaphoresis, change in appetite, change in weigh or increased thirst Hematologic/Lymphatic: No anemia, purpura, petechiae.   She feels that she bruises easily. Allergic/Immunologic: No itchy/runny eyes, nasal congestion, recent allergic reactions, rashes  ALLERGIES: Allergies  Allergen Reactions  . Amoxicillin Shortness Of Breath  . Penicillins Shortness  Of Breath    Skin turns red  . Bupropion Other (See Comments)    Mood changes    HOME MEDICATIONS:  Current Outpatient Prescriptions:  .  acetaminophen-codeine (TYLENOL #3) 300-30 MG per tablet, Take 1 tablet by mouth every 8 (eight) hours as needed for moderate pain., Disp: 45 tablet, Rfl: 1 .  amphetamine-dextroamphetamine (ADDERALL XR) 20 MG 24 hr capsule, Take 1 capsule (20 mg total) by mouth 2 (two) times daily., Disp: 60 capsule, Rfl: 0 .  cholecalciferol (VITAMIN D) 1000 UNITS tablet, Take 1,000 Units by mouth daily., Disp: , Rfl:  .  COPAXONE 40 MG/ML SOSY, Inject 40 mg into the skin 3 (three) times a week., Disp: 36 Syringe, Rfl: 3 .  diazepam (VALIUM) 5 MG tablet, 1-2 tablets prior to flight, Disp: , Rfl:  .  doxepin (SINEQUAN) 10 MG capsule, Take 1 capsule (10 mg total) by mouth at bedtime., Disp: 30 capsule, Rfl: 5 .  FLUoxetine (PROZAC) 10 MG capsule, Take by mouth., Disp: , Rfl:  .  Fluticasone Furoate-Vilanterol (BREO ELLIPTA) 100-25 MCG/INH AEPB, Inhale into the lungs., Disp: , Rfl:  .  imipramine (TOFRANIL) 25 MG tablet, Take one or two at bedtime, Disp: 60 tablet, Rfl: 3 .  lamoTRIgine (LAMICTAL) 100 MG tablet, Take 3 tablets (300 mg total) by mouth 2 (two) times daily., Disp: 540 tablet, Rfl: 3 .  levETIRAcetam (KEPPRA) 1000 MG tablet, Take 1 tablet (1,000 mg total) by mouth 2 (two) times daily., Disp: 60 tablet, Rfl: 11 .  meloxicam  (MOBIC) 15 MG tablet, Take 1 tablet (15 mg total) by mouth daily., Disp: 30 tablet, Rfl: 11 .  naproxen (NAPROSYN) 500 MG tablet, Take 500 mg by mouth 2 (two) times daily with a meal., Disp: , Rfl:  .  temazepam (RESTORIL) 15 MG capsule, Take 1 capsule (15 mg total) by mouth at bedtime as needed for sleep., Disp: 30 capsule, Rfl: 3 .  VITAMIN B1-B12 IM, Inject 1,000 mcg into the muscle every 30 (thirty) days., Disp: , Rfl:  .  nitrofurantoin, macrocrystal-monohydrate, (MACROBID) 100 MG capsule, Take 1 capsule (100 mg total) by mouth 2 (two) times daily., Disp: 14 capsule, Rfl: 0 No current facility-administered medications for this visit.   Facility-Administered Medications Ordered in Other Visits:  .  gadopentetate dimeglumine (MAGNEVIST) injection 12 mL, 12 mL, Intravenous, Once PRN, Kingston Guiles, Pearletha Furl, MD  PAST MEDICAL HISTORY: Past Medical History:  Diagnosis Date  . Chronic headaches   . Multiple sclerosis (HCC)   . Seizures (HCC)     PAST SURGICAL HISTORY: Past Surgical History:  Procedure Laterality Date  . ABDOMINAL SURGERY    . COSMETIC SURGERY      FAMILY HISTORY: Family History  Problem Relation Age of Onset  . Cancer Father     SOCIAL HISTORY:  Social History   Social History  . Marital status: Married    Spouse name: Zakirah Weingart  . Number of children: 2  . Years of education: 12   Occupational History  . unemployeed    Social History Main Topics  . Smoking status: Former Games developer  . Smokeless tobacco: Never Used     Comment: quit 2010  . Alcohol use No  . Drug use: No  . Sexual activity: Yes    Partners: Male   Other Topics Concern  . Not on file   Social History Narrative   Lives at home with husband and children   Drinks caffeine: 2 cups a day  PHYSICAL EXAM  Vitals:   12/16/16 0900  BP: 123/72  Pulse: 90  Resp: 16  Weight: 113 lb 8 oz (51.5 kg)  Height: 5\' 2"  (1.575 m)    Body mass index is 20.76 kg/m.   General: The patient is  well-developed and well-nourished and in no acute distress    Eyes:  Visual acuity is 20/100 OS and 20/70 OD.      Neurologic Exam  Mental status: The patient is alert and oriented x 3 at the time of the examination. The patient has apparent normal recent and remote memory, with an apparently normal attention span and concentration ability.   Speech is normal.  Cranial nerves: Extraocular movements show left nystagmus on left gaze and mild right nystagmus on right gaze..  Facial symmetry is present. Facial strength and sensation is normal. Trapezius strength is normal.. No dysarthria is noted.  The tongue is midline, and the patient has symmetric elevation of the soft palate. No obvious hearing deficits are noted.  Motor:  Muscle bulk is normal.   Tone is normal. Strength is  5 / 5 in all 4 extremities.   Sensory: Sensory testing shows mild reduced temperature sensation on the right arm/leg.   Vibration sensation was symmetric.Marland Kitchen.     Coordination: Finger-nose-finger and heel to shin is performed reasonably well.. Rapid alternating movements are slightly slower in the right hand than the left.  Heel-to-shin is a little off on the right  Gait and station: Station is normal. the gait is a little wide and her tandem gait is wide. There is a slight right foot drop when she walked longer distances.Romberg is negative.   Reflexes: Deep tendon reflexes are symmetric and increased (spread at knees) bilaterally.      DIAGNOSTIC DATA (LABS, IMAGING, TESTING) - I reviewed patient records, labs, notes, testing and imaging myself where available.     ASSESSMENT AND PLAN  Multiple sclerosis (HCC) - Plan: Urinalysis, Routine w reflex microscopic, Culture, Urine  Partial epilepsy with impairment of consciousness (HCC)  Ataxic gait  Visual disturbance  Urinary tract infection without hematuria, site unspecified - Plan: Urinalysis, Routine w reflex microscopic, Culture, Urine   1.    We  discussed that she appears to have had a second exacerbation (optic neuritis and possible also further right sided clumsiness). She also had one at the end of July 2018 involving gait. Therefore, I feel that she needs to switch to a different disease modifying therapy. She is reluctant to go on Tysabri because of risk of PML. We also discussed Tecfidera and ocrelizumab and she was more interested in [place]. I discussed with her that we are doing a drug study with a medication with a similar mechanism of action ([id number]) and she is potentially interested in the drug study. I will have her speak to our research coordinator. If she does not pass her screen we will get her started on Tecfidera. Discussed that if she passes initial part of the screen she will have an MRI as part of the study. If she does not passes the screen, I will want to get her to do an MRI the regular way to better assess how much subclinical activity she was having and also determine a new baseline. 2.   She might have a urinary tract infection. We will check a UA, urine culture and I will have her take Macrodantin.  3.   continue Adderall XR 20 mg bid,    4.   Continue  the lamotrigine and levetiracetam -- on this combination, she has not had any seizures..   5.    continue to stay active and exercises as tolerated.  6 .   She will return to see me  for the study or in 4 months (if not in study) or sooner if she has new or worsening neurologic symptoms     45 minute face-to-face interaction with greater than one half of the time counseling and coordinating care about her new exacerbation and need to switch to different disease modifying therapy.   Merrell Rettinger A. Epimenio Foot, MD, PhD, [place] 12/16/2016, 10:23 AM Certified in Neurology, Clinical Neurophysiology, Sleep Medicine, Pain Medicine and Neuroimaging  [facility] [address] [place], [place] [address] [phone]

## 2016-12-17 LAB — URINALYSIS, ROUTINE W REFLEX MICROSCOPIC
BILIRUBIN UA: NEGATIVE
Glucose, UA: NEGATIVE
Ketones, UA: NEGATIVE
LEUKOCYTES UA: NEGATIVE
Nitrite, UA: NEGATIVE
PH UA: 5 (ref 5.0–7.5)
RBC UA: NEGATIVE
Specific Gravity, UA: >=1.03 — AB (ref 1.005–1.030)
Urobilinogen, Ur: 0.2 mg/dL (ref 0.2–1.0)

## 2016-12-18 LAB — URINE CULTURE

## 2016-12-19 ENCOUNTER — Ambulatory Visit (INDEPENDENT_AMBULATORY_CARE_PROVIDER_SITE_OTHER): Payer: Self-pay | Admitting: Neurology

## 2016-12-19 DIAGNOSIS — G35 Multiple sclerosis: Secondary | ICD-10-CM

## 2016-12-19 NOTE — Progress Notes (Signed)
Paula Anderson is here today for her a screening visit for the MS drug study. She notes no changes compared to her last visit.  VA = 20/100 OU (3) Mild right spasticity (1) Mild gait ataxia (1) and mild numbness on the right to touch/vib (2) Mild cognitive disturbance and mild bladder issues (1) and ambulating > 500 m (1)  3.0

## 2016-12-23 ENCOUNTER — Other Ambulatory Visit: Payer: Self-pay | Admitting: Neurology

## 2016-12-23 DIAGNOSIS — G35 Multiple sclerosis: Secondary | ICD-10-CM

## 2017-01-01 ENCOUNTER — Telehealth: Payer: Self-pay | Admitting: *Deleted

## 2017-01-01 NOTE — Telephone Encounter (Signed)
LMOM for med.records at [facility] to call.  I am trying to locate an old EKG she may have had there around 10/11/13. If they have this, can they please fax it to Dr. Epimenio FootSater at [phone]. This is needed for comparison with an EKG she had in our office, in preparation for an MS studyshe may be participating in/fim

## 2017-01-01 NOTE — Telephone Encounter (Signed)
Crystal with [facility] medical records is calling to advise please sent a cover sheet requesting EKG. Please fax to [phone].

## 2017-01-01 NOTE — Telephone Encounter (Signed)
Request for EKG faxed to [facility] med records as requested/fim

## 2017-01-02 NOTE — Telephone Encounter (Signed)
RAS aware/fim

## 2017-01-02 NOTE — Telephone Encounter (Signed)
Have not received EKG from [facility].  LMOM med. records requesting call back/fim

## 2017-01-02 NOTE — Telephone Encounter (Signed)
Crystal [facility] [phone] returned RN's call. She said the old system goes from 2004-2016 and there is no records for this pt at all.

## 2017-01-02 NOTE — Telephone Encounter (Signed)
LMOM for [facility] med. records again requesting EKG/fim

## 2017-01-06 ENCOUNTER — Encounter: Payer: Self-pay | Admitting: Neurology

## 2017-01-06 NOTE — Telephone Encounter (Signed)
I called and left a message

## 2017-01-07 ENCOUNTER — Telehealth: Payer: Self-pay | Admitting: Neurology

## 2017-01-07 ENCOUNTER — Encounter: Payer: Self-pay | Admitting: Neurology

## 2017-01-07 ENCOUNTER — Other Ambulatory Visit: Payer: Self-pay | Admitting: Neurology

## 2017-01-07 ENCOUNTER — Other Ambulatory Visit: Payer: 59

## 2017-01-07 DIAGNOSIS — R9431 Abnormal electrocardiogram [ECG] [EKG]: Secondary | ICD-10-CM | POA: Insufficient documentation

## 2017-01-07 NOTE — Telephone Encounter (Signed)
Noted/fim 

## 2017-01-07 NOTE — Telephone Encounter (Signed)
Referral sent via Epic.  Faxed  EKG to [facility]  Telephone [phone] - fax [phone]  .

## 2017-01-11 ENCOUNTER — Encounter: Payer: Self-pay | Admitting: Neurology

## 2017-01-13 ENCOUNTER — Other Ambulatory Visit: Payer: Self-pay

## 2017-01-13 ENCOUNTER — Ambulatory Visit (INDEPENDENT_AMBULATORY_CARE_PROVIDER_SITE_OTHER): Payer: 59 | Admitting: Cardiology

## 2017-01-13 ENCOUNTER — Encounter: Payer: Self-pay | Admitting: Cardiology

## 2017-01-13 ENCOUNTER — Telehealth: Payer: Self-pay | Admitting: Neurology

## 2017-01-13 VITALS — BP 104/68 | HR 83 | Ht 62.0 in | Wt 112.1 lb

## 2017-01-13 DIAGNOSIS — R9431 Abnormal electrocardiogram [ECG] [EKG]: Secondary | ICD-10-CM

## 2017-01-13 DIAGNOSIS — R0602 Shortness of breath: Secondary | ICD-10-CM

## 2017-01-13 DIAGNOSIS — G35 Multiple sclerosis: Secondary | ICD-10-CM

## 2017-01-13 DIAGNOSIS — G40219 Localization-related (focal) (partial) symptomatic epilepsy and epileptic syndromes with complex partial seizures, intractable, without status epilepticus: Secondary | ICD-10-CM

## 2017-01-13 NOTE — Addendum Note (Signed)
Addended by: Craige Cotta on: 01/13/2017 10:34 AM   Modules accepted: Orders

## 2017-01-13 NOTE — Patient Instructions (Signed)
Medication Instructions:  Your physician recommends that you continue on your current medications as directed. Please refer to the Current Medication list given to you today.  Labwork: None  Testing/Procedures: Your physician has recommended that you wear a holter monitor. Holter monitors are medical devices that record the heart's electrical activity. Doctors most often use these monitors to diagnose arrhythmias. Arrhythmias are problems with the speed or rhythm of the heartbeat. The monitor is a small, portable device. You can wear one while you do your normal daily activities. This is usually used to diagnose what is causing palpitations/syncope (passing out).  Your physician has requested that you have an echocardiogram. Echocardiography is a painless test that uses sound waves to create images of your heart. It provides your doctor with information about the size and shape of your heart and how well your heart's chambers and valves are working. This procedure takes approximately one hour. There are no restrictions for this procedure.  Your physician has requested that you have a lexiscan myoview. For further information please visit [email or web address]. Please follow instruction sheet, as given.   Follow-Up: Your physician recommends that you schedule a follow-up appointment in: 1 month   Any Other Special Instructions Will Be Listed Below (If Applicable).     If you need a refill on your cardiac medications before your next appointment, please call your pharmacy.   [facility]  Garey HamAshley A, RN, BSN   Regadenoson injection What is this medicine? REGADENOSON is used to test the heart for coronary artery disease. It is used in patients who can not exercise for their stress test. This medicine may be used for other purposes; ask your health care provider or pharmacist if you have questions. COMMON BRAND NAME(S): Lexiscan What should I tell my health care provider before I take  this medicine? They need to know if you have any of these conditions: -heart problems -lung or breathing disease, like asthma or COPD -an unusual or allergic reaction to regadenoson, other medicines, foods, dyes, or preservatives -pregnant or trying to get pregnant -breast-feeding How should I use this medicine? This medicine is for injection into a vein. It is given by a health care professional in a hospital or clinic setting. Talk to your pediatrician regarding the use of this medicine in children. Special care may be needed. Overdosage: If you think you have taken too much of this medicine contact a poison control center or emergency room at once. NOTE: This medicine is only for you. Do not share this medicine with others. What if I miss a dose? This does not apply. What may interact with this medicine? -caffeine -dipyridamole -guarana -theophylline This list may not describe all possible interactions. Give your health care provider a list of all the medicines, herbs, non-prescription drugs, or dietary supplements you use. Also tell them if you smoke, drink alcohol, or use illegal drugs. Some items may interact with your medicine. What should I watch for while using this medicine? Your condition will be monitored carefully while you are receiving this medicine. Do not take medicines, foods, or drinks with caffeine (like coffee, tea, or colas) for at least 12 hours before your test. If you do not know if something contains caffeine, ask your health care professional. What side effects may I notice from receiving this medicine? Side effects that you should report to your doctor or health care professional as soon as possible: -allergic reactions like skin rash, itching or hives, swelling of the face, lips, or  tongue -breathing problems -chest pain, tightness or palpitations -severe headache Side effects that usually do not require medical attention (report to your doctor or health care  professional if they continue or are bothersome): -flushing -headache -irritation or pain at site where injected -nausea, vomiting This list may not describe all possible side effects. Call your doctor for medical advice about side effects. You may report side effects to FDA at 1-800-FDA-1088. Where should I keep my medicine? This drug is given in a hospital or clinic and will not be stored at home. NOTE: This sheet is a summary. It may not cover all possible information. If you have questions about this medicine, talk to your doctor, pharmacist, or health care provider.  2018 Elsevier/Gold Standard (2007-10-05 15:08:13) Echocardiogram An echocardiogram, or echocardiography, uses sound waves (ultrasound) to produce an image of your heart. The echocardiogram is simple, painless, obtained within a short period of time, and offers valuable information to your health care provider. The images from an echocardiogram can provide information such as:  Evidence of coronary artery disease (CAD).  Heart size.  Heart muscle function.  Heart valve function.  Aneurysm detection.  Evidence of a past heart attack.  Fluid buildup around the heart.  Heart muscle thickening.  Assess heart valve function.  Tell a health care provider about:  Any allergies you have.  All medicines you are taking, including vitamins, herbs, eye drops, creams, and over-the-counter medicines.  Any problems you or family members have had with anesthetic medicines.  Any blood disorders you have.  Any surgeries you have had.  Any medical conditions you have.  Whether you are pregnant or may be pregnant. What happens before the procedure? No special preparation is needed. Eat and drink normally. What happens during the procedure?  In order to produce an image of your heart, gel will be applied to your chest and a wand-like tool (transducer) will be moved over your chest. The gel will help transmit the sound  waves from the transducer. The sound waves will harmlessly bounce off your heart to allow the heart images to be captured in real-time motion. These images will then be recorded.  You may need an IV to receive a medicine that improves the quality of the pictures. What happens after the procedure? You may return to your normal schedule including diet, activities, and medicines, unless your health care provider tells you otherwise. This information is not intended to replace advice given to you by your health care provider. Make sure you discuss any questions you have with your health care provider. Document Released: 02/02/2000 Document Revised: 09/23/2015 Document Reviewed: 10/12/2012 Elsevier Interactive Patient Education  2017 Elsevier Inc.  Holter Monitoring A Holter monitor is a small device that is used to detect abnormal heart rhythms. It clips to your clothing and is connected by wires to flat, sticky disks (electrodes) that attach to your chest. It is worn continuously for 24-48 hours. Follow these instructions at home:  Wear your Holter monitor at all times, even while exercising and sleeping, for as long as directed by your health care provider.  Make sure that the Holter monitor is safely clipped to your clothing or close to your body as recommended by your health care provider.  Do not get the monitor or wires wet.  Do not put body lotion or moisturizer on your chest.  Keep your skin clean.  Keep a diary of your daily activities, such as walking and doing chores. If you feel that your  heartbeat is abnormal or that your heart is fluttering or skipping a beat: ? Record what you are doing when it happens. ? Record what time of day the symptoms occur.  Return your Holter monitor as directed by your health care provider.  Keep all follow-up visits as directed by your health care provider. This is important. Get help right away if:  You feel lightheaded or you faint.  You  have trouble breathing.  You feel pain in your chest, upper arm, or jaw.  You feel sick to your stomach and your skin is pale, cool, or damp.  You heartbeat feels unusual or abnormal. This information is not intended to replace advice given to you by your health care provider. Make sure you discuss any questions you have with your health care provider. Document Released: 11/03/2003 Document Revised: 07/13/2015 Document Reviewed: 09/13/2013 Elsevier Interactive Patient Education  [employer].

## 2017-01-13 NOTE — Telephone Encounter (Signed)
I spoke to Paula Anderson about her cardiology visit and her MS worsening over the past few months with 2 exacerbations.   She has several cardiology test next week.  We need to go ahead and check an MRI of the brain and cervical spine to determine if there has been more significant changes compared to her last studies. If so, I then would want her to go on a highly efficacious medication (Tysabri or ocrelizumab). If milder changes, then either Tecfidera or the drug study would be appropriate

## 2017-01-13 NOTE — Progress Notes (Signed)
Cardiology Office Note:    Date:  01/13/2017   ID:  Paula Anderson, DOB 1975-07-11, MRN [id number]  PCP:  Macy Mis, MD  Cardiologist:  Garwin Brothers, MD   Referring MD: Asa Lente, MD    ASSESSMENT:    1. Multiple sclerosis (HCC)   2. Abnormal EKG   3. Partial symptomatic epilepsy with complex partial seizures, intractable, without status epilepticus (HCC)    PLAN:    In order of problems listed above:  1. Primary prevention stressed with patient.  Importance of compliance with diet and medications stressed. 2. EKG has abnormalities such as short PR.  Also she has a skipped beat feeling for which I will do a 48-hour Holter monitor.  I will also look for any problems or abnormalities with thyroid and we are trying to get TSH report from primary care physician's office. 3. Echocardiogram will be done to assess murmur heard on auscultation and patient will undergo Lexiscan sestamibi to assess for any objective evidence of coronary artery disease 4. Blood pressure stable.  She quit smoking many years ago and I told her never to go back to smoking. 5. Follow-up appointment in a month or earlier if she has any concerns.   Medication Adjustments/Labs and Tests Ordered: Current medicines are reviewed at length with the patient today.  Concerns regarding medicines are outlined above.  No orders of the defined types were placed in this encounter.  No orders of the defined types were placed in this encounter.    History of Present Illness:    Paula Anderson is a 41 y.o. female who is being seen today for the evaluation of normal EKG at the request of Sater, Pearletha Furl, MD.  Patient is a pleasant 41 year old female.  She has history of multiple sclerosis and seizures.  She mentions to me that she occasionally has chest tightness not related to exertion.  She denies any chest pain orthopnea or PND.  She leads a sedentary lifestyle because of multiple sclerosis.  She sees a  neurologist and of course her primary care physician on regular basis.  No syncope.  She mentions to me that she occasionally will feel a skipped beat.  No history suggesting palpitations.  She is concerned about this as she has been told of an abnormal EKG.  At the time of my evaluation she is alert awake oriented and in no distress.  Chest tightness that she has does not have any radiation to the neck or to the arms.  Its grading is 2/10.  Past Medical History:  Diagnosis Date  . Chronic headaches   . Multiple sclerosis (HCC)   . Seizures (HCC)     Past Surgical History:  Procedure Laterality Date  . ABDOMINAL SURGERY    . COSMETIC SURGERY      Current Medications: Current Meds  Medication Sig  . amphetamine-dextroamphetamine (ADDERALL XR) 20 MG 24 hr capsule Take 1 capsule (20 mg total) by mouth 2 (two) times daily.  Marland Kitchen COPAXONE 40 MG/ML SOSY Inject 40 mg into the skin 3 (three) times a week.  . Fluticasone Furoate-Vilanterol (BREO ELLIPTA) 100-25 MCG/INH AEPB Inhale into the lungs.  . lamoTRIgine (LAMICTAL) 100 MG tablet Take 3 tablets (300 mg total) by mouth 2 (two) times daily.  Marland Kitchen levETIRAcetam (KEPPRA) 1000 MG tablet Take 1 tablet (1,000 mg total) by mouth 2 (two) times daily.  . naproxen (NAPROSYN) 500 MG tablet Take 500 mg by mouth 2 (two) times daily with a  meal.     Allergies:   Amoxicillin; Penicillins; and Bupropion   Social History   Socioeconomic History  . Marital status: Married    Spouse name: Richard  . Number of children: 2  . Years of education: 12  . Highest education level: None  Soc74ial Needs  . Financial resource strain: None  . Food insecurity - worry: None  . Food insecurity - inability: None  . Transportation needs - medical: None  . Transportation needs - non-medical: None  Occupational History  . Occupation: unemployeed  Tobacco Use  . Smoking status: Former Games developermoker  . Smokeless tobacco: Never Used  . Tobacco comment: quit 2010  Substance and  Sexual Activity  . Alcohol use: No    Alcohol/week: 0.0 oz  . Drug use: No  . Sexual activity: Yes    Partners: Male  Other Topics Concern  . None  Social History Narrative   Lives at home with husband and children   Drinks caffeine: 2 cups a day     Family History: The patient's family history includes Cancer in her father; Heart failure in her paternal grandmother.  ROS:   Please see the history of present illness.    All other systems reviewed and are negative.  EKGs/Labs/Other Studies Reviewed:    The following studies were reviewed today: EKG reveals sinus rhythm with short PR interval and nonspecific ST changes.  Other office visit records were also reviewed   Recent Labs: No results found for requested labs within last 8760 hours.  Recent Lipid Panel No results found for: CHOL, TRIG, HDL, CHOLHDL, VLDL, LDLCALC, LDLDIRECT  Physical Exam:    VS:  BP 104/68   Pulse 83   Ht 5\' 2"  (1.575 m)   Wt 112 lb 1.9 oz (50.9 kg)   SpO2 98%   BMI 20.51 kg/m     Wt Readings from Last 3 Encounters:  01/13/17 112 lb 1.9 oz (50.9 kg)  12/16/16 113 lb 8 oz (51.5 kg)  09/16/16 107 lb (48.5 kg)     GEN: Patient is in no acute distress HEENT: Normal NECK: No JVD; No carotid bruits LYMPHATICS: No lymphadenopathy CARDIAC: S1 S2 regular, 2/6 systolic murmur at the apex. RESPIRATORY:  Clear to auscultation without rales, wheezing or rhonchi  ABDOMEN: Soft, non-tender, non-distended MUSCULOSKELETAL:  No edema; No deformity  SKIN: Warm and dry NEUROLOGIC:  Alert and oriented x 3 PSYCHIATRIC:  Normal affect    Signed, Garwin Brothersajan R Trudee Chirino, MD  01/13/2017 10:14 AM    [facility]

## 2017-01-14 ENCOUNTER — Telehealth (HOSPITAL_COMMUNITY): Payer: Self-pay | Admitting: *Deleted

## 2017-01-14 LAB — TSH: TSH: 1.22 u[IU]/mL (ref 0.450–4.500)

## 2017-01-14 NOTE — Telephone Encounter (Signed)
Left message on voicemail per DPR in reference to upcoming appointment scheduled on 01/20/17 at 0945 with detailed instructions given per Myocardial Perfusion Study Information Sheet for the test. LM to arrive 15 minutes early, and that it is imperative to arrive on time for appointment to keep from having the test rescheduled. If you need to cancel or reschedule your appointment, please call the office within 24 hours of your appointment. Failure to do so may result in a cancellation of your appointment, and a $50 no show fee. Phone number given for call back for any questions.

## 2017-01-15 ENCOUNTER — Ambulatory Visit: Payer: Self-pay | Admitting: Neurology

## 2017-01-15 ENCOUNTER — Other Ambulatory Visit: Payer: Self-pay | Admitting: Neurology

## 2017-01-15 ENCOUNTER — Telehealth: Payer: Self-pay | Admitting: *Deleted

## 2017-01-15 MED ORDER — CLINDAMYCIN HCL 150 MG PO CAPS: ORAL_CAPSULE | ORAL | 0 refills | Status: DC

## 2017-01-15 MED ORDER — AMPHETAMINE-DEXTROAMPHET ER 20 MG PO CP24: 20.0000 mg | ORAL_CAPSULE | Freq: Two times a day (BID) | ORAL | 0 refills | Status: DC

## 2017-01-15 NOTE — Telephone Encounter (Signed)
Adderall rx. up front [facility]/fim 

## 2017-01-16 ENCOUNTER — Ambulatory Visit: Payer: 59 | Admitting: Neurology

## 2017-01-16 ENCOUNTER — Other Ambulatory Visit (HOSPITAL_COMMUNITY): Payer: 59

## 2017-01-20 ENCOUNTER — Ambulatory Visit: Payer: 59

## 2017-01-20 ENCOUNTER — Other Ambulatory Visit: Payer: Self-pay | Admitting: Cardiology

## 2017-01-20 ENCOUNTER — Ambulatory Visit (HOSPITAL_COMMUNITY): Payer: 59 | Attending: Internal Medicine

## 2017-01-20 ENCOUNTER — Other Ambulatory Visit: Payer: Self-pay

## 2017-01-20 ENCOUNTER — Ambulatory Visit (HOSPITAL_BASED_OUTPATIENT_CLINIC_OR_DEPARTMENT_OTHER): Payer: 59

## 2017-01-20 DIAGNOSIS — Z87891 Personal history of nicotine dependence: Secondary | ICD-10-CM | POA: Insufficient documentation

## 2017-01-20 DIAGNOSIS — R011 Cardiac murmur, unspecified: Secondary | ICD-10-CM | POA: Diagnosis not present

## 2017-01-20 DIAGNOSIS — R0602 Shortness of breath: Secondary | ICD-10-CM | POA: Insufficient documentation

## 2017-01-20 DIAGNOSIS — I2510 Atherosclerotic heart disease of native coronary artery without angina pectoris: Secondary | ICD-10-CM | POA: Insufficient documentation

## 2017-01-20 DIAGNOSIS — R002 Palpitations: Secondary | ICD-10-CM | POA: Diagnosis not present

## 2017-01-20 DIAGNOSIS — R9431 Abnormal electrocardiogram [ECG] [EKG]: Secondary | ICD-10-CM

## 2017-01-20 DIAGNOSIS — R079 Chest pain, unspecified: Secondary | ICD-10-CM | POA: Diagnosis not present

## 2017-01-20 LAB — MYOCARDIAL PERFUSION IMAGING
CHL CUP NUCLEAR SSS: 4
CSEPPHR: 118 {beats}/min
LHR: 0.29
LVDIAVOL: 65 mL (ref 46–106)
LVSYSVOL: 29 mL
Rest HR: 80 {beats}/min
SDS: 2
SRS: 2
TID: 1.05

## 2017-01-20 LAB — ECHOCARDIOGRAM COMPLETE
Height: 62 in
WEIGHTICAEL: 1792 [oz_av]

## 2017-01-20 MED ORDER — TECHNETIUM TC 99M TETROFOSMIN IV KIT
10.8000 | PACK | Freq: Once | INTRAVENOUS | Status: AC | PRN
  Administered 2017-01-20: 10.8 via INTRAVENOUS
  Filled 2017-01-20: qty 11

## 2017-01-20 MED ORDER — TECHNETIUM TC 99M TETROFOSMIN IV KIT
30.8000 | PACK | Freq: Once | INTRAVENOUS | Status: AC | PRN
  Administered 2017-01-20: 30.8 via INTRAVENOUS
  Filled 2017-01-20: qty 31

## 2017-01-20 MED ORDER — REGADENOSON 0.4 MG/5ML IV SOLN
0.4000 mg | Freq: Once | INTRAVENOUS | Status: AC
  Administered 2017-01-20: 0.4 mg via INTRAVENOUS

## 2017-01-21 ENCOUNTER — Encounter: Payer: Self-pay | Admitting: Cardiology

## 2017-01-21 ENCOUNTER — Encounter: Payer: Self-pay | Admitting: Neurology

## 2017-01-22 ENCOUNTER — Telehealth: Payer: Self-pay | Admitting: Neurology

## 2017-01-22 ENCOUNTER — Ambulatory Visit (INDEPENDENT_AMBULATORY_CARE_PROVIDER_SITE_OTHER): Payer: 59

## 2017-01-22 DIAGNOSIS — G35 Multiple sclerosis: Secondary | ICD-10-CM

## 2017-01-22 MED ORDER — GADOPENTETATE DIMEGLUMINE 469.01 MG/ML IV SOLN: 10.0000 mL | Freq: Once | INTRAVENOUS | Status: DC | PRN

## 2017-01-22 NOTE — Telephone Encounter (Signed)
I called Michellewith the results of the MRI of the brain and cervical spine. Compared to the MRI from 02/03/2016, there appears to be one small midbrain focus that did not enhance. Otherwise MRIs looked about the same.  She will be coming back in on Wednesday for the screening visit for the MS drugs study.

## 2017-01-23 ENCOUNTER — Encounter: Payer: Self-pay | Admitting: Neurology

## 2017-01-24 ENCOUNTER — Encounter: Payer: Self-pay | Admitting: Cardiology

## 2017-01-28 ENCOUNTER — Encounter: Payer: Self-pay | Admitting: Neurology

## 2017-01-28 ENCOUNTER — Encounter: Payer: Self-pay | Admitting: Cardiology

## 2017-01-29 ENCOUNTER — Ambulatory Visit (INDEPENDENT_AMBULATORY_CARE_PROVIDER_SITE_OTHER): Payer: 59 | Admitting: Neurology

## 2017-01-29 ENCOUNTER — Telehealth: Payer: Self-pay

## 2017-01-29 ENCOUNTER — Other Ambulatory Visit: Payer: Self-pay

## 2017-01-29 ENCOUNTER — Encounter: Payer: Self-pay | Admitting: Neurology

## 2017-01-29 DIAGNOSIS — G35 Multiple sclerosis: Secondary | ICD-10-CM

## 2017-01-29 DIAGNOSIS — R0602 Shortness of breath: Secondary | ICD-10-CM

## 2017-01-29 NOTE — Telephone Encounter (Signed)
Informed patient of her holter monitor results. She was instructed that we would like to have a d-dimer drawn. Patient will come tomorrow for this.

## 2017-01-29 NOTE — Addendum Note (Signed)
Addended by: Craige Cotta on: 01/29/2017 11:55 AM   Modules accepted: Orders

## 2017-01-29 NOTE — Progress Notes (Signed)
The patient is here today for a re-screening visit for that drug study. Unfortunately, this needs to be rescheduled due to research staff being unavailable today.

## 2017-01-30 ENCOUNTER — Ambulatory Visit: Payer: Self-pay | Admitting: Neurology

## 2017-01-31 ENCOUNTER — Telehealth: Payer: Self-pay

## 2017-01-31 NOTE — Telephone Encounter (Signed)
Patient stated that she would come on Monday 12/17 to have the d-dimer drawn. The importance of this lab test was discussed with the patient.

## 2017-02-03 ENCOUNTER — Encounter: Payer: Self-pay | Admitting: Cardiology

## 2017-02-03 ENCOUNTER — Encounter: Payer: Self-pay | Admitting: Neurology

## 2017-02-04 ENCOUNTER — Ambulatory Visit (INDEPENDENT_AMBULATORY_CARE_PROVIDER_SITE_OTHER): Payer: 59 | Admitting: Neurology

## 2017-02-04 DIAGNOSIS — G35 Multiple sclerosis: Secondary | ICD-10-CM

## 2017-02-04 DIAGNOSIS — N390 Urinary tract infection, site not specified: Secondary | ICD-10-CM

## 2017-02-04 LAB — D-DIMER, QUANTITATIVE: D-DIMER: 0.39 mg/L FEU (ref 0.00–0.49)

## 2017-02-04 MED ORDER — LEVOFLOXACIN 500 MG PO TABS: 500.0000 mg | ORAL_TABLET | Freq: Every day | ORAL | 0 refills | Status: DC

## 2017-02-04 NOTE — Progress Notes (Signed)
Mrs. Paula Anderson is here for a repeat screening visit for the MS study.

## 2017-02-19 ENCOUNTER — Ambulatory Visit: Payer: 59 | Admitting: Cardiology

## 2017-02-23 ENCOUNTER — Encounter: Payer: Self-pay | Admitting: Neurology

## 2017-02-24 ENCOUNTER — Ambulatory Visit: Payer: Self-pay | Admitting: Neurology

## 2017-02-24 ENCOUNTER — Other Ambulatory Visit: Payer: Self-pay | Admitting: Neurology

## 2017-02-24 DIAGNOSIS — N390 Urinary tract infection, site not specified: Secondary | ICD-10-CM

## 2017-02-24 DIAGNOSIS — G35 Multiple sclerosis: Secondary | ICD-10-CM

## 2017-02-24 MED ORDER — AMPHETAMINE-DEXTROAMPHET ER 20 MG PO CP24: 20.0000 mg | ORAL_CAPSULE | Freq: Two times a day (BID) | ORAL | 0 refills | Status: DC

## 2017-02-25 ENCOUNTER — Encounter: Payer: Self-pay | Admitting: Neurology

## 2017-02-25 NOTE — Addendum Note (Signed)
Addended by: Tamera Stands D on: 02/25/2017 09:15 AM   Modules accepted: Orders

## 2017-02-26 NOTE — Telephone Encounter (Signed)
PA for Adderall XR 20mg  capsules #30/30 completed by phone with [facility] (phone# [phone]).  Dx: ADHD (F90.9). Approved for dates 02/18/17 thru 03/20/18.  Case ID: [id number]

## 2017-02-27 LAB — URINE CULTURE: Organism ID, Bacteria: NO GROWTH

## 2017-03-03 ENCOUNTER — Ambulatory Visit: Payer: Self-pay | Admitting: Neurology

## 2017-03-03 ENCOUNTER — Telehealth: Payer: Self-pay | Admitting: Neurology

## 2017-03-03 NOTE — Telephone Encounter (Signed)
[facility] @ [facility] has called re: COPAXONE 40 MG/ML SOSY she states due to pt's insurance pt will need Glatiramer, she is asking for a prescription for it, please call

## 2017-03-03 NOTE — Telephone Encounter (Signed)
Pt. is no longer on Copaxone.  Please let [facility] know this if they call back. Thanks!

## 2017-03-04 ENCOUNTER — Other Ambulatory Visit: Payer: Self-pay | Admitting: Neurology

## 2017-03-04 DIAGNOSIS — G35 Multiple sclerosis: Secondary | ICD-10-CM

## 2017-03-10 ENCOUNTER — Other Ambulatory Visit: Payer: Self-pay | Admitting: *Deleted

## 2017-03-10 ENCOUNTER — Ambulatory Visit
Admission: RE | Admit: 2017-03-10 | Discharge: 2017-03-10 | Disposition: A | Discharge status: Home / Self Care | Payer: No Typology Code available for payment source | Source: Ambulatory Visit | Attending: Neurology | Admitting: Neurology

## 2017-03-10 DIAGNOSIS — G35 Multiple sclerosis: Secondary | ICD-10-CM

## 2017-03-10 MED ORDER — GADOBENATE DIMEGLUMINE 529 MG/ML IV SOLN
10.2000 mL | Freq: Once | INTRAVENOUS | Status: AC | PRN
  Administered 2017-03-10: 10.2 mL via INTRAVENOUS

## 2017-03-11 NOTE — Telephone Encounter (Signed)
Noted/fim 

## 2017-03-11 NOTE — Telephone Encounter (Signed)
Amy/[facility] [phone] called said the pt called on Saturday requesting another RX for copaxone. She was advised of RN's message. She will relay msg to the pt and have her call the clinic.  FYI

## 2017-03-20 ENCOUNTER — Encounter (INDEPENDENT_AMBULATORY_CARE_PROVIDER_SITE_OTHER): Payer: Self-pay | Admitting: Neurology

## 2017-03-20 DIAGNOSIS — Z0289 Encounter for other administrative examinations: Secondary | ICD-10-CM

## 2017-03-21 ENCOUNTER — Encounter: Payer: Self-pay | Admitting: Neurology

## 2017-03-21 ENCOUNTER — Encounter (INDEPENDENT_AMBULATORY_CARE_PROVIDER_SITE_OTHER): Payer: Managed Care, Other (non HMO) | Admitting: Neurology

## 2017-03-21 DIAGNOSIS — G35 Multiple sclerosis: Secondary | ICD-10-CM

## 2017-03-21 NOTE — Progress Notes (Signed)
Paula Anderson came in for her randomization visit for the MS drug study.  The EDSS was performed. Her score was 4.    Visual acuity was 20/100 OD and 20/70 OS (4 reduced to 3), deep tendon reflexes were elevated in the right leg and it was mild spasticity in both legs. There was mild weakness in toe and ankle extension on the right.    Cerebellar testing showed slight dysmetria 1+ and mild gait ataxia with reduced tandem gait..   She had reduced touch, vibration and position sense in the right leg (2), bladder equals 1, cognition equals 1, fatigue equals 1, 500 m in 10 minutes = 1   Score = 4.0

## 2017-03-24 ENCOUNTER — Encounter: Payer: Self-pay | Admitting: Neurology

## 2017-03-28 ENCOUNTER — Telehealth: Payer: Self-pay | Admitting: *Deleted

## 2017-03-28 MED ORDER — AMPHETAMINE-DEXTROAMPHET ER 20 MG PO CP24: 20.0000 mg | ORAL_CAPSULE | Freq: Two times a day (BID) | ORAL | 0 refills | Status: DC

## 2017-03-28 NOTE — Telephone Encounter (Signed)
Adderall rx. given directly to pt. when she was in for research visit today/fim

## 2017-04-03 ENCOUNTER — Encounter: Payer: Self-pay | Admitting: Neurology

## 2017-04-28 ENCOUNTER — Encounter: Payer: Self-pay | Admitting: Neurology

## 2017-04-28 MED ORDER — AMPHETAMINE-DEXTROAMPHET ER 20 MG PO CP24: 20.0000 mg | ORAL_CAPSULE | Freq: Two times a day (BID) | ORAL | 0 refills | Status: DC

## 2017-04-29 ENCOUNTER — Other Ambulatory Visit: Payer: Self-pay | Admitting: Neurology

## 2017-04-29 DIAGNOSIS — G35 Multiple sclerosis: Secondary | ICD-10-CM

## 2017-05-07 ENCOUNTER — Ambulatory Visit
Admission: RE | Admit: 2017-05-07 | Discharge: 2017-05-07 | Disposition: A | Discharge status: Home / Self Care | Payer: No Typology Code available for payment source | Source: Ambulatory Visit | Attending: Neurology | Admitting: Neurology

## 2017-05-07 ENCOUNTER — Encounter: Payer: Self-pay | Admitting: Neurology

## 2017-05-07 DIAGNOSIS — G35 Multiple sclerosis: Secondary | ICD-10-CM

## 2017-05-09 ENCOUNTER — Telehealth: Payer: Self-pay | Admitting: *Deleted

## 2017-05-09 NOTE — Telephone Encounter (Signed)
I left a detailed message with MRI results, ok per dpr. I advised patient to call back with any questions or concerns.   

## 2017-05-09 NOTE — Telephone Encounter (Signed)
-----   Message from Asa Lente, MD sent at 05/09/2017 10:24 AM EDT ----- Please let her know the MRI looks good, no new MS lesions

## 2017-05-20 ENCOUNTER — Ambulatory Visit: Payer: Self-pay | Admitting: Neurology

## 2017-05-21 ENCOUNTER — Encounter: Payer: Self-pay | Admitting: Neurology

## 2017-05-26 ENCOUNTER — Ambulatory Visit: Payer: Self-pay | Admitting: Neurology

## 2017-05-27 ENCOUNTER — Other Ambulatory Visit: Payer: Self-pay | Admitting: Neurology

## 2017-05-27 ENCOUNTER — Encounter: Payer: Self-pay | Admitting: Neurology

## 2017-05-27 ENCOUNTER — Ambulatory Visit: Payer: Self-pay | Admitting: Neurology

## 2017-05-27 DIAGNOSIS — G35 Multiple sclerosis: Secondary | ICD-10-CM

## 2017-05-27 MED ORDER — METHYLPHENIDATE HCL ER 54 MG PO TB24: 54.0000 mg | ORAL_TABLET | ORAL | 0 refills | Status: DC

## 2017-05-27 NOTE — Progress Notes (Signed)
[id number] 

## 2017-06-02 ENCOUNTER — Encounter: Payer: Self-pay | Admitting: *Deleted

## 2017-06-03 ENCOUNTER — Encounter: Payer: Self-pay | Admitting: *Deleted

## 2017-06-03 ENCOUNTER — Ambulatory Visit: Payer: Self-pay | Admitting: Neurology

## 2017-06-10 ENCOUNTER — Ambulatory Visit: Payer: Self-pay | Admitting: Neurology

## 2017-06-10 ENCOUNTER — Encounter: Payer: Self-pay | Admitting: Neurology

## 2017-06-11 ENCOUNTER — Other Ambulatory Visit: Payer: Self-pay | Admitting: Neurology

## 2017-06-11 MED ORDER — ETODOLAC 400 MG PO TABS: 400.0000 mg | ORAL_TABLET | Freq: Two times a day (BID) | ORAL | 5 refills | Status: DC

## 2017-06-18 ENCOUNTER — Encounter: Payer: Self-pay | Admitting: Neurology

## 2017-06-25 ENCOUNTER — Encounter: Payer: Self-pay | Admitting: Neurology

## 2017-06-26 ENCOUNTER — Encounter: Payer: Self-pay | Admitting: Neurology

## 2017-06-30 ENCOUNTER — Other Ambulatory Visit: Payer: Self-pay | Admitting: Neurology

## 2017-06-30 MED ORDER — METHYLPHENIDATE HCL ER 54 MG PO TB24: 54.0000 mg | ORAL_TABLET | ORAL | 0 refills | Status: DC

## 2017-07-08 ENCOUNTER — Ambulatory Visit: Payer: Self-pay | Admitting: Neurology

## 2017-07-17 ENCOUNTER — Encounter: Payer: Self-pay | Admitting: Neurology

## 2017-07-21 MED ORDER — TRAMADOL HCL 50 MG PO TABS: 50.0000 mg | ORAL_TABLET | Freq: Four times a day (QID) | ORAL | 0 refills | Status: DC | PRN

## 2017-07-31 ENCOUNTER — Other Ambulatory Visit: Payer: Self-pay | Admitting: Neurology

## 2017-07-31 MED ORDER — METHYLPHENIDATE HCL ER 54 MG PO TB24: 54.0000 mg | ORAL_TABLET | ORAL | 0 refills | Status: DC

## 2017-08-05 ENCOUNTER — Ambulatory Visit: Payer: Self-pay | Admitting: Neurology

## 2017-08-05 ENCOUNTER — Other Ambulatory Visit: Payer: Self-pay | Admitting: Neurology

## 2017-08-05 MED ORDER — AMPHETAMINE-DEXTROAMPHET ER 20 MG PO CP24: 20.0000 mg | ORAL_CAPSULE | Freq: Two times a day (BID) | ORAL | 0 refills | Status: DC

## 2017-08-29 ENCOUNTER — Encounter: Payer: Self-pay | Admitting: Neurology

## 2017-09-02 MED ORDER — AMPHETAMINE-DEXTROAMPHET ER 20 MG PO CP24: 20.0000 mg | ORAL_CAPSULE | Freq: Two times a day (BID) | ORAL | 0 refills | Status: DC

## 2017-09-04 ENCOUNTER — Ambulatory Visit: Payer: Self-pay | Admitting: Neurology

## 2017-09-09 ENCOUNTER — Telehealth: Payer: Self-pay | Admitting: Neurology

## 2017-09-09 DIAGNOSIS — G3281 Cerebellar ataxia in diseases classified elsewhere: Secondary | ICD-10-CM

## 2017-09-09 DIAGNOSIS — H532 Diplopia: Secondary | ICD-10-CM

## 2017-09-09 DIAGNOSIS — G35 Multiple sclerosis: Secondary | ICD-10-CM

## 2017-09-09 NOTE — Telephone Encounter (Signed)
MR Brain w/wo contrast Dr. Tommy Rainwater Auth: [id number] (exp. 09/09/17 to 12/08/17) per Burnett Corrente wanted the patient to come in on Aug 7 at [facility].

## 2017-09-09 NOTE — Telephone Encounter (Signed)
Over the last couple months, she has noted a little more difficulty with her gait and also notes double vision when she looks to the left or the right.  She notes the double vision most when she is driving and find to look over a shoulder.  She continues on [id number] as a disease modifying therapy for her MS.   She is now tolerating it pretty well.    Her last EDSS was 4.0 which had progressed from 3.5 three months earlier.  We discussed getting a new MRI of the brain due to her new symptoms (gait and diplopia).  I will place that order.

## 2017-09-24 ENCOUNTER — Ambulatory Visit (INDEPENDENT_AMBULATORY_CARE_PROVIDER_SITE_OTHER): Payer: Managed Care, Other (non HMO)

## 2017-09-24 DIAGNOSIS — G35 Multiple sclerosis: Secondary | ICD-10-CM

## 2017-09-24 DIAGNOSIS — H532 Diplopia: Secondary | ICD-10-CM

## 2017-09-24 DIAGNOSIS — G3281 Cerebellar ataxia in diseases classified elsewhere: Secondary | ICD-10-CM | POA: Diagnosis not present

## 2017-09-24 MED ORDER — GADOPENTETATE DIMEGLUMINE 469.01 MG/ML IV SOLN
11.0000 mL | Freq: Once | INTRAVENOUS | Status: AC | PRN
  Administered 2017-09-24: 11 mL via INTRAVENOUS

## 2017-09-30 ENCOUNTER — Ambulatory Visit: Payer: Self-pay | Admitting: Neurology

## 2017-10-04 ENCOUNTER — Other Ambulatory Visit: Payer: Self-pay

## 2017-10-05 MED ORDER — AMPHETAMINE-DEXTROAMPHET ER 20 MG PO CP24: 20.0000 mg | ORAL_CAPSULE | Freq: Two times a day (BID) | ORAL | 0 refills | Status: DC

## 2017-10-08 ENCOUNTER — Telehealth: Payer: Self-pay | Admitting: Neurology

## 2017-10-08 DIAGNOSIS — R4189 Other symptoms and signs involving cognitive functions and awareness: Secondary | ICD-10-CM

## 2017-10-08 DIAGNOSIS — G40219 Localization-related (focal) (partial) symptomatic epilepsy and epileptic syndromes with complex partial seizures, intractable, without status epilepticus: Secondary | ICD-10-CM

## 2017-10-08 DIAGNOSIS — M5440 Lumbago with sciatica, unspecified side: Secondary | ICD-10-CM

## 2017-10-08 DIAGNOSIS — R350 Frequency of micturition: Secondary | ICD-10-CM

## 2017-10-08 DIAGNOSIS — G35 Multiple sclerosis: Secondary | ICD-10-CM

## 2017-10-08 MED ORDER — CYCLOBENZAPRINE HCL 5 MG PO TABS: 5.0000 mg | ORAL_TABLET | Freq: Three times a day (TID) | ORAL | 5 refills | Status: DC | PRN

## 2017-10-08 NOTE — Telephone Encounter (Signed)
Marland Kitchen   [facility]  PATIENT: Paula Anderson DOB: 1976/01/16  REFERRING DOCTOR OR PCP:  Delbert Harness    Fax [phone] ______________________________   HISTORICAL  CHIEF COMPLAINT:  Chief Complaint  Patient presents with  . Multiple Sclerosis    research visit and summary    HISTORY OF PRESENT ILLNESS:  Paula Anderson is a 42 yo woman with multiple sclerosis and seizures.   Update 10/08/2017:    Paula Anderson started the [id number] MS drug study in January.  She has tolerated the medication well.  The last MRI did not show any new lesions.  Since then, she has not any definite exacerbations but has had continued worsening.   Specifically, her gait is more off balance.  She is not able to walk as far as she had earlier in the year.   She has clumsiness/ataxia in the right arm and leg.  Her gait is ataxic and wide.  She sometimes veers as she walks.   She has mild weakness right leg.  Muscle tone is increased on the right and she has abnormal reflexes.  She notes mild numbness in the right arm and leg.  She often drops items out of the right hand.  She has noticed changes in ability to do fine motor tasks..     She has urinary frequency and urgency.  She is being evaluated by urology.  She continues to report reduced vision that is worse out of the left eye.   Visual acuity is 20/100 on the left and 20/70 on the right.  She notes more difficulty with cognition.  Specifically, she has had reduced short-term memory, poor focus and attention and reduced ability to multitask and plan/executive function.  She will sometimes have mild verbal fluency issues.  Adderall has helped some with the cognitive function a little bit but she is nowhere near the baseline she was a couple years ago.   She also notes some fatigue.  This is mild most days but will be more severe some days.  She has not had any further seizures.  Is worse.  She notes that the back stiffens up when the night and  she has the most pain first thing in the morning.   Update 12/16/2016:    Since her last visit, she feels her balance is worse.   She does not use a cane and can walk a mile if she needed to.   She has had a couple falls and hurt her arm the one time.    She has more right hand clumsiness and writing is worse.  She denies any new numbness or dysesthesia.   The only medication she has been on is Copaxone.   we discussed that with 2 exacerbations in the Rebif 6 months that I feel she needs to switch to a different medication and we discussed options.    She also feels the vision is worse in her right eye.    Usually the left eye is worse than the right but the right eye worsened 2 weeks ago.   Her left eye is still worse but right eye now almost as bad.     She has had several UTI which improved with Bactrim and then came back and was treated with Keflex and she thinks it may have come back.  The recurrent UTI was resistant to Bactrim by report (10/14/16) and she was placed on Keflex so we'll try another antibiotic.   She had had some depression  earlier this year and feels better on Prozac.   she feels mood is fine now.  No suicidal ideation.  Cognition is baseline.     She has not had any seizures on the new combination of Lamictal plus Keppra (added)  She has vertigo and has rarely taken Zofran but none in the past year.   Her last steroid was early August,  2018 after her last exacerbation.     Repeat Cervical spine MRI 02/03/2016 showed small non-nenhancing focus adjacent to C7T1 in the left posterolateral spinal cord.    Repeat Brain MRI 02/03/2016 is unchanged.     ________________________________________- From 09/16/2016:  MS:   She is on copaxone and she tolerates it well.    Last week, she had the onset of bilateral hand clumsiness.   No major change in strength or sensation.   She needed help to tie her shoes.   The gait seems slightly worse.    Gait/strength/sensation: Gait is mildly wide  and slightly off balanced.    She feels people look at her when she walks and someone once thought she was drunk.   The right leg is dragging more and she catches the foot on the floor when she walks.     She stumbles daily and is falling every week or two.    She often needs to grab on to the wall or furniture for balance .    She has tingling in her legs but no more pronounced numbness.     Bladder/bowel:  She notes needing to urinate x 10 most days and x 1 most nights.   This is more than last year.      Vision: She has diplopia to left gaze (old) but now notes it when she looks right (new this week)   She feels vision is very reduced on her left.     She notes asymmetry with brightness and color.       Fatigue/sleep:  She has physical and mental fatigue.  Adderall XR 30 has helped.    She has insomnia and delayed phase sllep disorder (she goes to bed around 3 am and feels she can't go to bed earlier.)   Ambien caused a hangover effect the next day.     Mood/cognition:  Mood is doing well. She denies depression or anxiety. She does have some irritability and gets angry easily at times.     She has noted difficulty with short term memory, decreased ability to come up with the right word and decreased focus.     Seizure:  No seizures since her last visit. Earlier this year, she had a seizure lasting 5 minutes. Her previous seizure was at least a year earlier she is on Keppra plus lamotrigine.      She had an EEG in [place] that reportedly had spikes but we do not have those results at this time.       MS History:   She presented with difficulties with speech, memory and gait balance in 2013. MRIs were consistent with MS.   In 2014, she had a severe episode of vertigo and was taken to the hospital. Repeat MRI imaging was performed and she was diagnosed officially with MS. She was started on Copaxone.   She received IV steroids couple times when she had severe fatigue.     She tolerates Copaxone well.     No difficulty with skin reactions.  I personally reviewed MRI images of the brain and  spine.   In the posterior fossa there is a right middle cerebellar peduncle lesion, possible medulla focus and left midbrain focus and two small cerebellar hemisphere foci. She has thalamic foci.  There are multiple periventricular, deep and juxtacortical white matter foci in the hemispheres. Some of these are oriented to the ventricles. Some are hypointense on T1-weighted images. None enhance.  C- Spine 10/19/2014 reported without plaques.  Repeat Brain MRI 02/03/2016 is unchanged.   Repeat Cervical spine MRI 02/03/2016 shows small no-nenhancing focus adjacent to C7T1 in the left posterolateral spinal cord  REVIEW OF SYSTEMS: Constitutional: No fevers, chills, sweats, or change in appetite.   She reports fatigue and insomnia. Eyes: as above Ear, nose and throat: No hearing loss, ear pain, nasal congestion, sore throat.   She has vertigo. Cardiovascular: No chest pain, palpitations Respiratory: No shortness of breath at rest or with exertion.   No wheezes GastrointestinaI: No nausea, vomiting, diarrhea, abdominal pain, fecal incontinence GenitourinaShe has had a couple UTIs.Musculoskeletal: No neck pain.  She  notesback pain Integumentary: No rash, pruritus, skin lesions Neurological: as above Psychiatric: Notes mild depression at this time.  No anxiety Endocrine: No palpitations, diaphoresis, change in appetite, change in weigh or increased thirst Hematologic/Lymphatic: No anemia, purpura, petechiae.   She feels that she bruises easily. Allergic/Immunologic: No itchy/runny eyes, nasal congestion, recent allergic reactions, rashes  ALLERGIES: Allergies  Allergen Reactions  . Amoxicillin Shortness Of Breath  . Penicillins Shortness Of Breath    Skin turns red  . Bupropion Other (See Comments)    Mood changes    HOME MEDICATIONS:  Current Outpatient Medications:  .  acetaminophen-codeine (TYLENOL #3)  300-30 MG per tablet, Take 1 tablet by mouth every 8 (eight) hours as needed for moderate pain. (Patient not taking: Reported on 01/13/2017), Disp: 45 tablet, Rfl: 1 .  amphetamine-dextroamphetamine (ADDERALL XR) 20 MG 24 hr capsule, Take 1 capsule (20 mg total) by mouth 2 (two) times daily., Disp: 60 capsule, Rfl: 0 .  cholecalciferol (VITAMIN D) 1000 UNITS tablet, Take 1,000 Units by mouth daily., Disp: , Rfl:  .  clindamycin (CLEOCIN) 150 MG capsule, One po tid x 10 days, Disp: 30 capsule, Rfl: 0 .  COPAXONE 40 MG/ML SOSY, Inject 40 mg into the skin 3 (three) times a week., Disp: 36 Syringe, Rfl: 3 .  cyclobenzaprine (FLEXERIL) 5 MG tablet, Take 1 tablet (5 mg total) by mouth every 8 (eight) hours as needed for muscle spasms., Disp: 90 tablet, Rfl: 5 .  diazepam (VALIUM) 5 MG tablet, 1-2 tablets prior to flight, Disp: , Rfl:  .  doxepin (SINEQUAN) 10 MG capsule, Take 1 capsule (10 mg total) by mouth at bedtime. (Patient not taking: Reported on 01/13/2017), Disp: 30 capsule, Rfl: 5 .  etodolac (LODINE) 400 MG tablet, Take 1 tablet (400 mg total) by mouth 2 (two) times daily., Disp: 60 tablet, Rfl: 5 .  FLUoxetine (PROZAC) 10 MG capsule, Take by mouth., Disp: , Rfl:  .  Fluticasone Furoate-Vilanterol (BREO ELLIPTA) 100-25 MCG/INH AEPB, Inhale into the lungs., Disp: , Rfl:  .  imipramine (TOFRANIL) 25 MG tablet, Take one or two at bedtime (Patient not taking: Reported on 01/13/2017), Disp: 60 tablet, Rfl: 3 .  lamoTRIgine (LAMICTAL) 100 MG tablet, Take 3 tablets (300 mg total) by mouth 2 (two) times daily., Disp: 540 tablet, Rfl: 3 .  levETIRAcetam (KEPPRA) 1000 MG tablet, Take 1 tablet (1,000 mg total) by mouth 2 (two) times daily., Disp: 60 tablet, Rfl: 11 .  levofloxacin (LEVAQUIN) 500 MG tablet, Take 1 tablet (500 mg total) by mouth daily., Disp: 10 tablet, Rfl: 0 .  meloxicam (MOBIC) 15 MG tablet, Take 1 tablet (15 mg total) by mouth daily. (Patient not taking: Reported on 01/13/2017), Disp: 30  tablet, Rfl: 11 .  naproxen (NAPROSYN) 500 MG tablet, Take 500 mg by mouth 2 (two) times daily with a meal., Disp: , Rfl:  .  nitrofurantoin, macrocrystal-monohydrate, (MACROBID) 100 MG capsule, Take 1 capsule (100 mg total) by mouth 2 (two) times daily. (Patient not taking: Reported on 01/13/2017), Disp: 14 capsule, Rfl: 0 .  traMADol (ULTRAM) 50 MG tablet, Take 1 tablet (50 mg total) by mouth 4 (four) times daily as needed., Disp: 120 tablet, Rfl: 0 No current facility-administered medications for this visit.   Facility-Administered Medications Ordered in Other Visits:  .  gadopentetate dimeglumine (MAGNEVIST) injection 10 mL, 10 mL, Intravenous, Once PRN, Kamarii Carton A, MD .  gadopentetate dimeglumine (MAGNEVIST) injection 12 mL, 12 mL, Intravenous, Once PRN, Iven Earnhart, Pearletha Furl, MD  PAST MEDICAL HISTORY: Past Medical History:  Diagnosis Date  . Chronic headaches   . Multiple sclerosis (HCC)   . Seizures (HCC)     PAST SURGICAL HISTORY: Past Surgical History:  Procedure Laterality Date  . ABDOMINAL SURGERY    . COSMETIC SURGERY      FAMILY HISTORY: Family History  Problem Relation Age of Onset  . Cancer Father   . Heart failure Paternal Grandmother     SOCIAL HISTORY:  Social History   Socioeconomic History  . Marital status: Married    Spouse name: Ragna Kramlich  . Number of children: 2  . Years of education: 37  . Highest education level: Not on file  Occupational History  . Occupation: unemployeed  Social Needs  . Financial resource strain: Not on file  . Food insecurity:    Worry: Not on file    Inability: Not on file  . Transportation needs:    Medical: Not on file    Non-medical: Not on file  Tobacco Use  . Smoking status: Former Games developer  . Smokeless tobacco: Never Used  . Tobacco comment: quit 2010  Substance and Sexual Activity  . Alcohol use: No    Alcohol/week: 0.0 standard drinks  . Drug use: No  . Sexual activity: Yes    Partners: Male  Lifestyle    . Physical activity:    Days per week: Not on file    Minutes per session: Not on file  . Stress: Not on file  Relationships  . Social connections:    Talks on phone: Not on file    Gets together: Not on file    Attends religious service: Not on file    Active member of club or organization: Not on file    Attends meetings of clubs or organizations: Not on file    Relationship status: Not on file  . Intimate partner violence:    Fear of current or ex partner: Not on file    Emotionally abused: Not on file    Physically abused: Not on file    Forced sexual activity: Not on file  Other Topics Concern  . Not on file  Social History Narrative   Lives at home with husband and children   Drinks caffeine: 2 cups a day     PHYSICAL EXAM  Vitals:   12/16/16 0900  BP: 123/72  Pulse: 90  Resp: 16  Weight: 113 lb 8 oz (51.5 kg)  Height: 5\' 2"  (1.575 m)    There is no height or weight on file to calculate BMI.   General: The patient is well-developed and well-nourished and in no acute distress    Eyes:  Visual acuity is 20/100 OS and 20/70 OD.      Neurologic Exam  Mental status: The patient is alert and oriented x 3 at the time of the examination. The patient has apparent normal recent and remote memory, with an apparently normal attention span and concentration ability.   Speech is normal.  Cranial nerves: Extraocular movements show left nystagmus on left gaze and mild right nystagmus on right gaze.. Facial strength and sensation was normal.   Slight dysarthria the tongue is midline, and the patient has symmetric elevation of the soft palate. No obvious hearing deficits are noted.  Motor:  Muscle bulk is normal.   Tone is normal. Strength is  5 / 5 in all 4 extremities.   Sensory: Sensory testing shows mild reduced temperature sensation on the right arm/leg.   Vibration was reduced on the right.     Coordination:   Rapid alternating movements are slightly slower in the  right hand than the left.  Finger-nose-finger is mildly ataxic on the right heel-to-shin is reducedf on the right  Gait and station: Station is normal.  The gait is wide and she is unable to do a tandem walk.  She has a right foot drop.  Romberg is negative.  Reflexes: Deep tendon reflexes are increased, equally in the arms and more so on the right in the legs.  She has cross abductor reflexes at both knees.  There is nonsustained clonus on the right.    DIAGNOSTIC DATA (LABS, IMAGING, TESTING) - I reviewed patient records, labs, notes, testing and imaging myself where available.     ASSESSMENT AND PLAN  Multiple sclerosis (HCC) - Plan: Ambulatory referral to Neuropsychology, CANCELED: Ambulatory referral to Neuropsychology  Partial symptomatic epilepsy with complex partial seizures, intractable, without status epilepticus (HCC) - Plan: Ambulatory referral to Neuropsychology, CANCELED: Ambulatory referral to Neuropsychology  Midline low back pain with sciatica, sciatica laterality unspecified, unspecified chronicity  Disturbed cognition - Plan: Ambulatory referral to Neuropsychology, CANCELED: Ambulatory referral to Neuropsychology  Increased frequency of urination   1.    She we will continue [id number] and therapy. 2.   Continue Adderall .  Flexeril for the back pain. 3.     Continue the lamotrigine and levetiracetam -- on this combination, she has not had any seizures. 4.    Due to the combination of physical and cognitive impairments, she is unable to work.  Her disability is permanent.  To better by her energy, we will do neurocognitive evaluation 5.    continue to stay active and exercises as tolerated.  6 .  Return as scheduled for the drug study or call with any new or worsening symptoms  Eh Sesay A. Epimenio Foot, MD, PhD, [place] 10/08/2017, 3:59 PM Certified in Neurology, Clinical Neurophysiology, Sleep Medicine, Pain Medicine and Neuroimaging  [facility] [address] [place], [place] [address] [phone]

## 2017-10-17 ENCOUNTER — Telehealth: Payer: Self-pay | Admitting: *Deleted

## 2017-10-17 MED ORDER — LAMOTRIGINE 100 MG PO TABS: 300.0000 mg | ORAL_TABLET | Freq: Two times a day (BID) | ORAL | 3 refills | Status: DC

## 2017-10-17 NOTE — Telephone Encounter (Signed)
Lamotrigine escribed to [facility] in response to faxed request from them/fim

## 2017-10-17 NOTE — Addendum Note (Signed)
Addended by: Candis Schatz I on: 10/17/2017 12:59 PM   Modules accepted: Orders

## 2017-11-05 MED ORDER — AMPHETAMINE-DEXTROAMPHET ER 20 MG PO CP24: 20.0000 mg | ORAL_CAPSULE | Freq: Two times a day (BID) | ORAL | 0 refills | Status: DC

## 2017-11-28 ENCOUNTER — Ambulatory Visit: Payer: Managed Care, Other (non HMO) | Admitting: Neurology

## 2017-11-28 ENCOUNTER — Encounter: Payer: Self-pay | Admitting: Neurology

## 2017-11-28 ENCOUNTER — Other Ambulatory Visit: Payer: Self-pay | Admitting: Neurology

## 2017-11-28 ENCOUNTER — Other Ambulatory Visit: Payer: Self-pay

## 2017-11-28 VITALS — BP 95/62 | HR 86 | Resp 16 | Ht 62.0 in | Wt 117.5 lb

## 2017-11-28 DIAGNOSIS — R5383 Other fatigue: Secondary | ICD-10-CM

## 2017-11-28 DIAGNOSIS — G35D Multiple sclerosis, unspecified: Secondary | ICD-10-CM

## 2017-11-28 DIAGNOSIS — R4189 Other symptoms and signs involving cognitive functions and awareness: Secondary | ICD-10-CM

## 2017-11-28 DIAGNOSIS — R7989 Other specified abnormal findings of blood chemistry: Secondary | ICD-10-CM

## 2017-11-28 DIAGNOSIS — G40219 Localization-related (focal) (partial) symptomatic epilepsy and epileptic syndromes with complex partial seizures, intractable, without status epilepticus: Secondary | ICD-10-CM

## 2017-11-28 DIAGNOSIS — H532 Diplopia: Secondary | ICD-10-CM

## 2017-11-28 DIAGNOSIS — R260 Ataxic gait: Secondary | ICD-10-CM

## 2017-11-28 DIAGNOSIS — G35 Multiple sclerosis: Secondary | ICD-10-CM

## 2017-11-28 NOTE — Progress Notes (Addendum)
Marland Kitchen   [facility]  PATIENT: Paula Anderson DOB: [birth year]  REFERRING DOCTOR OR PCP:  Paula Anderson    Fax [phone] ______________________________   HISTORICAL  CHIEF COMPLAINT:  Chief Complaint  Patient presents with  . Multiple Sclerosis    Here today with c/o difficulty swallowing, occ. choking episode with food, not liquids. Also sts. peripheral vision is worse/fim    HISTORY OF PRESENT ILLNESS:  Paula Anderson is a 42 y.o. woman with multiple sclerosis and seizures.    Update 11/28/2017: She has been enrolled in the [id number] MS study (drug is very similar to Tecfidera).   Although she has tolerated medicine well, she has had some worsening symptoms over the last year with some progression.   She is interested in switching to a different disease modifying therapy.  We had a very long discussion about options.  As she has had some progression my recommendation would be to either start Mayzent, Tysabri or Ocrevus.  We went over the pros and cons of each of these medications.  Additionally literature was provided.  She will need to return for an early termination visit and she will think about her options between now and then.  She was tested for the JCV antibody earlier this year and was negative.  She notes that vision is worse, left more than right.  This is more in the peripheral vcision and noted while driving.   Colors may be mildly different.  She also has noted some difficulty with swallowing.    She has had some choking on food of all consistency.   She is not choking on liquids.      She also notes her gait is more off balanced and she doesnt feel safe walking with her grandchildren.     She has dropped some items.     She has not had any seizures on Lamictal with Keppra.   Update 12/16/2016:    Since her last visit, she feels her balance is worse.   She does not use a cane and can walk a mile if she needed to.   She has had a couple falls and hurt her arm the  one time.    She has more right hand clumsiness and writing is worse.  She denies any new numbness or dysesthesia.   The only medication she has been on is Copaxone.   we discussed that with 2 exacerbations in the Rebif 6 months that I feel she needs to switch to a different medication and we discussed options.    She also feels the vision is worse in her right eye.    Usually the left eye is worse than the right but the right eye worsened 2 weeks ago.   Her left eye is still worse but right eye now almost as bad.     She has had several UTI which improved with Bactrim and then came back and was treated with Keflex and she thinks it may have come back.  The recurrent UTI was resistant to Bactrim by report (10/14/16) and she was placed on Keflex so we'll try another antibiotic.   She had had some depression earlier this year and feels better on Prozac.   she feels mood is fine now.  No suicidal ideation.  Cognition is baseline.     She has not had any seizures on the new combination of Lamictal plus Keppra (added)  She has vertigo and has rarely taken Zofran but none in the  past year.   Her last steroid was early August,  2018 after her last exacerbation.     Repeat Cervical spine MRI 02/03/2016 showed small non-nenhancing focus adjacent to C7T1 in the left posterolateral spinal cord.    Repeat Brain MRI 02/03/2016 is unchanged.     ________________________________________- From 09/16/2016:  MS:   She is on copaxone and she tolerates it well.    Last week, she had the onset of bilateral hand clumsiness.   No major change in strength or sensation.   She needed help to tie her shoes.   The gait seems slightly worse.    Gait/strength/sensation: Gait is mildly wide and slightly off balanced.    She feels people look at her when she walks and someone once thought she was drunk.   The right leg is dragging more and she catches the foot on the floor when she walks.     She stumbles daily and is falling every  week or two.    She often needs to grab on to the wall or furniture for balance .    She has tingling in her legs but no more pronounced numbness.     Bladder/bowel:  She notes needing to urinate x 10 most days and x 1 most nights.   This is more than last year.      Vision: She has diplopia to left gaze (old) but now notes it when she looks right (new this week)   She feels vision is very reduced on her left.     She notes asymmetry with brightness and color.       Fatigue/sleep:  She has physical and mental fatigue.  Adderall XR 30 has helped.    She has insomnia and delayed phase sllep disorder (she goes to bed around 3 am and feels she can't go to bed earlier.)   Ambien caused a hangover effect the next day.     Mood/cognition:  Mood is doing well. She denies depression or anxiety. She does have some irritability and gets angry easily at times.     She has noted difficulty with short term memory, decreased ability to come up with the right word and decreased focus.     Seizure:  No seizures since her last visit. Earlier this year, she had a seizure lasting 5 minutes. Her previous seizure was at least a year earlier she is on Keppra plus lamotrigine.      She had an EEG in [place] that reportedly had spikes but we do not have those results at this time.       MS History:   She presented with difficulties with speech, memory and gait balance in 2013. MRIs were consistent with MS.   In 2014, she had a severe episode of vertigo and was taken to the hospital. Repeat MRI imaging was performed and she was diagnosed officially with MS. She was started on Copaxone.   She received IV steroids couple times when she had severe fatigue.     She tolerates Copaxone well.    No difficulty with skin reactions.  I personally reviewed MRI images of the brain and spine.   In the posterior fossa there is a right middle cerebellar peduncle lesion, possible medulla focus and left midbrain focus and two small cerebellar  hemisphere foci. She has thalamic foci.  There are multiple periventricular, deep and juxtacortical white matter foci in the hemispheres. Some of these are oriented to the ventricles. Some are  hypointense on T1-weighted images. None enhance.  C- Spine 10/19/2014 reported without plaques.  Repeat Brain MRI 02/03/2016 is unchanged.   Repeat Cervical spine MRI 02/03/2016 shows small no-nenhancing focus adjacent to C7T1 in the left posterolateral spinal cord  REVIEW OF SYSTEMS: Constitutional: No fevers, chills, sweats, or change in appetite.  She has fatigue and insomnia Eyes: as above Ear, nose and throat: No hearing loss, ear pain, nasal congestion, sore throat.   She has vertigo. Cardiovascular: No chest pain, palpitations Respiratory: No shortness of breath at rest or with exertion.   No wheezes GastrointestinaI: No nausea, vomiting, diarrhea, abdominal pain, fecal incontinence Genitourinal:   She has had multiple UTIs.  Musculoskeletal: No neck pain.  She  notesback pain Integumentary: No rash, pruritus, skin lesions Neurological: as above Psychiatric: Notes mild depression at this time.  No anxiety Endocrine: No palpitations, diaphoresis, change in appetite, change in weigh or increased thirst Hematologic/Lymphatic: No anemia, purpura, petechiae.   She feels that she bruises easily. Allergic/Immunologic: No itchy/runny eyes, nasal congestion, recent allergic reactions, rashes  ALLERGIES: Allergies  Allergen Reactions  . Amoxicillin Shortness Of Breath  . Penicillins Shortness Of Breath    Skin turns red  . Bupropion Other (See Comments)    Mood changes    HOME MEDICATIONS:  Current Outpatient Medications:  .  amphetamine-dextroamphetamine (ADDERALL XR) 20 MG 24 hr capsule, Take 1 capsule (20 mg total) by mouth 2 (two) times daily., Disp: 60 capsule, Rfl: 0 .  cholecalciferol (VITAMIN D) 1000 UNITS tablet, Take 1,000 Units by mouth daily., Disp: , Rfl:  .  Fluticasone  Furoate-Vilanterol (BREO ELLIPTA) 100-25 MCG/INH AEPB, Inhale into the lungs., Disp: , Rfl:  .  lamoTRIgine (LAMICTAL) 100 MG tablet, Take 3 tablets (300 mg total) by mouth 2 (two) times daily., Disp: 540 tablet, Rfl: 3 .  levETIRAcetam (KEPPRA) 1000 MG tablet, Take 1 tablet (1,000 mg total) by mouth 2 (two) times daily., Disp: 60 tablet, Rfl: 11 .  naproxen (NAPROSYN) 500 MG tablet, Take 500 mg by mouth 2 (two) times daily with a meal., Disp: , Rfl:  .  diazepam (VALIUM) 5 MG tablet, 1-2 tablets prior to flight, Disp: , Rfl:  .  FLUoxetine (PROZAC) 10 MG capsule, Take by mouth., Disp: , Rfl:  .  imipramine (TOFRANIL) 25 MG tablet, Take one or two at bedtime (Patient not taking: Reported on 01/13/2017), Disp: 60 tablet, Rfl: 3 .  meloxicam (MOBIC) 15 MG tablet, Take 1 tablet (15 mg total) by mouth daily. (Patient not taking: Reported on 01/13/2017), Disp: 30 tablet, Rfl: 11 No current facility-administered medications for this visit.   Facility-Administered Medications Ordered in Other Visits:  .  gadopentetate dimeglumine (MAGNEVIST) injection 10 mL, 10 mL, Intravenous, Once PRN, Sater, Richard A, MD .  gadopentetate dimeglumine (MAGNEVIST) injection 12 mL, 12 mL, Intravenous, Once PRN, Sater, Pearletha Furl, MD  PAST MEDICAL HISTORY: Past Medical History:  Diagnosis Date  . Chronic headaches   . Multiple sclerosis (HCC)   . Seizures (HCC)     PAST SURGICAL HISTORY: Past Surgical History:  Procedure Laterality Date  . ABDOMINAL SURGERY    . COSMETIC SURGERY      FAMILY HISTORY: Family History  Problem Relation Age of Onset  . Cancer Father   . Heart failure Paternal Grandmother     SOCIAL HISTORY:  Social History   Socioeconomic History  . Marital status: Married    Spouse name: Richard  . Number of children: 2  . Years of  education: 41  . Highest education level: Not on file  Occupational History  . Occupation: unemployeed  Social Needs  . Financial resource strain: Not  on file  . Food insecurity:    Worry: Not on file    Inability: Not on file  . Transportation needs:    Medical: Not on file    Non-medical: Not on file  Tobacco Use  . Smoking status: Former Games developer  . Smokeless tobacco: Never Used  . Tobacco comment: quit 2010  Substance and Sexual Activity  . Alcohol use: No    Alcohol/week: 0.0 standard drinks  . Drug use: No  . Sexual activity: Yes    Partners: Male  Lifestyle  . Physical activity:    Days per week: Not on file    Minutes per session: Not on file  . Stress: Not on file  Relationships  . Social connections:    Talks on phone: Not on file    Gets together: Not on file    Attends religious service: Not on file    Active member of club or organization: Not on file    Attends meetings of clubs or organizations: Not on file    Relationship status: Not on file  . Intimate partner violence:    Fear of current or ex partner: Not on file    Emotionally abused: Not on file    Physically abused: Not on file    Forced sexual activity: Not on file  Other Topics Concern  . Not on file  Social History Narrative   Lives at home with husband and children   Drinks caffeine: 2 cups a day     PHYSICAL EXAM  Vitals:   11/28/17 0954  BP: 95/62  Pulse: 86  Resp: 16  Weight: 117 lb 8 oz (53.3 kg)  Height: 5\' 2"  (1.575 m)    Body mass index is 21.49 kg/m.   General: The patient is well-developed and well-nourished and in no acute distress    Eyes:  Visual acuity is 20/100 OS and 20/70 OD.      Neurologic Exam  Mental status: The patient is alert and oriented x 3 at the time of the examination. The patient has apparent normal recent and remote memory, with an apparently normal attention span and concentration ability.   Speech is normal.  Cranial nerves: She has nystagmus on lateral gaze...  Facial symmetry is present. Facial strength and sensation is normal. Trapezius strength is normal.. No dysarthria is noted.  The  tongue is midline, and the patient has symmetric elevation of the soft palate. No obvious hearing deficits are noted.  Motor:  Muscle bulk is normal.   Muscle tone is slightly increased in legs.  Strength is 5/5 except for plus/5 strength in the right foot  Sensory: She has reduced sensation on the right side..     Coordination:   Finger-nose-finger and heel-to-shin is reduced more on the right.  Rapid altering movements for slightly reduced in the right hand.  Gait and station: Station is normal.  The gait is mildly wide and the tandem gait is wide.  She does not need to use support.   She has a mild right foot drop.  Romberg is negative.   Reflexes: Deep tendon reflexes are symmetric and increased (spread at knees) bilaterally.  DTRs are higher on the right leg and left leg.    DIAGNOSTIC DATA (LABS, IMAGING, TESTING) - I reviewed patient records, labs, notes, testing and imaging  myself where available.     ASSESSMENT AND PLAN  Multiple sclerosis (HCC)  Partial symptomatic epilepsy with complex partial seizures, intractable, without status epilepticus (HCC)  Ataxic gait  Other fatigue  Diplopia  Disturbed cognition   1.     She would like to switch from the MS drug study to a different medication.  We discussed some options including Loanne Drilling, Ocrevus and Mayzent.  She is potentially interested in Tysabri so Mayzent and will give this further thought.  We will need to schedule an end of study visit and end of study MRI.  We will check additional blood work going the visit depending on her choice.   2.      continue Adderall XR 20 mg twice a day. 3.    Stay active and exercise as tolerated. 4.    Continue the lamotrigine and levetiracetam for the seizures.  This combination is worked well and she tolerates it well.   5.   She will return to see me  for the study or in 4 months (if not in study) or sooner if she has new or worsening neurologic symptoms     Richard A.  Epimenio Foot, MD, PhD, [place] 11/28/2017, 1:14 PM Certified in Neurology, Clinical Neurophysiology, Sleep Medicine, Pain Medicine and Neuroimaging  [facility] [address], [address] [place], [place] [address] [phone]

## 2017-12-04 ENCOUNTER — Other Ambulatory Visit: Payer: Self-pay

## 2017-12-04 ENCOUNTER — Other Ambulatory Visit: Payer: No Typology Code available for payment source

## 2017-12-04 MED ORDER — AMPHETAMINE-DEXTROAMPHET ER 20 MG PO CP24: 20.0000 mg | ORAL_CAPSULE | Freq: Two times a day (BID) | ORAL | 0 refills | Status: DC

## 2017-12-11 ENCOUNTER — Other Ambulatory Visit: Payer: Self-pay | Admitting: Neurology

## 2017-12-11 ENCOUNTER — Ambulatory Visit
Admission: RE | Admit: 2017-12-11 | Discharge: 2017-12-11 | Disposition: A | Discharge status: Home / Self Care | Payer: No Typology Code available for payment source | Source: Ambulatory Visit | Attending: Neurology | Admitting: Neurology

## 2017-12-11 DIAGNOSIS — G35 Multiple sclerosis: Secondary | ICD-10-CM

## 2017-12-11 DIAGNOSIS — R7989 Other specified abnormal findings of blood chemistry: Secondary | ICD-10-CM

## 2017-12-11 MED ORDER — GADOTERIDOL 279.3 MG/ML IV SOLN
10.0000 mL | Freq: Once | INTRAVENOUS | Status: AC | PRN
  Administered 2017-12-11: 10 mL via INTRAVENOUS

## 2017-12-11 MED ORDER — GADOXETATE DISODIUM 0.25 MMOL/ML IV SOLN
10.0000 mL | Freq: Once | INTRAVENOUS | Status: AC | PRN
  Administered 2017-12-11: 10 mL via INTRAVENOUS

## 2017-12-11 MED ORDER — TEMAZEPAM 15 MG PO CAPS: 15.0000 mg | ORAL_CAPSULE | Freq: Every evening | ORAL | 3 refills | Status: DC | PRN

## 2017-12-11 MED ORDER — GADOTERIDOL 279.3 MG/ML IV SOLN: 5.0000 mL | Freq: Once | INTRAVENOUS | Status: DC | PRN

## 2017-12-11 NOTE — Addendum Note (Signed)
Addended by: Tamera Stands D on: 12/11/2017 03:35 PM   Modules accepted: Orders

## 2017-12-12 LAB — VITAMIN D 25 HYDROXY (VIT D DEFICIENCY, FRACTURES): Vit D, 25-Hydroxy: 21.9 ng/mL — ABNORMAL LOW (ref 30.0–100.0)

## 2017-12-15 ENCOUNTER — Telehealth: Payer: Self-pay | Admitting: *Deleted

## 2017-12-15 MED ORDER — VITAMIN D (ERGOCALCIFEROL) 1.25 MG (50000 UNIT) PO CAPS: 50000.0000 [IU] | ORAL_CAPSULE | ORAL | 1 refills | Status: DC

## 2017-12-15 NOTE — Telephone Encounter (Signed)
-----   Message from Asa Lente, MD sent at 12/12/2017  2:25 PM EDT ----- Vitamin D is moderately low and she should take 50,000 units weekly x6 months (#12 #1  refill)and then she can switch to 5000 units daily OTC.

## 2017-12-15 NOTE — Telephone Encounter (Signed)
Called, spoke with patient. Advised Vit D level low and Dr. Epimenio Foot would like her to take 50,000U once weekly for 6 months. She can then switch to Vit D 5000 U OTC.   She would like rx to be called into [facility] on file. I escribed rx.   I also advised MRI looks good, no new lesions per Dr. Epimenio Foot. She verbalized understanding and appreciation.

## 2017-12-15 NOTE — Telephone Encounter (Signed)
Gave completed/signed Tysabri start form to [facility] to process for pt. 

## 2017-12-22 ENCOUNTER — Telehealth: Payer: Self-pay | Admitting: *Deleted

## 2017-12-22 NOTE — Telephone Encounter (Signed)
Received JCV lab result from [facility] that JCV 0.25 indeterminate. Stratify JC antibody ihibition assay final result: negative.

## 2018-01-05 ENCOUNTER — Ambulatory Visit: Payer: Self-pay | Admitting: Neurology

## 2018-01-05 ENCOUNTER — Other Ambulatory Visit: Payer: Self-pay | Admitting: *Deleted

## 2018-01-05 MED ORDER — SULFAMETHOXAZOLE-TRIMETHOPRIM 800-160 MG PO TABS: 1.0000 | ORAL_TABLET | Freq: Two times a day (BID) | ORAL | 0 refills | Status: DC

## 2018-01-07 ENCOUNTER — Other Ambulatory Visit: Payer: Self-pay | Admitting: *Deleted

## 2018-01-07 MED ORDER — AMPHETAMINE-DEXTROAMPHET ER 20 MG PO CP24: 20.0000 mg | ORAL_CAPSULE | Freq: Two times a day (BID) | ORAL | 0 refills | Status: DC

## 2018-01-14 ENCOUNTER — Telehealth: Payer: Self-pay | Admitting: *Deleted

## 2018-01-14 MED ORDER — MECLIZINE HCL 25 MG PO TABS: ORAL_TABLET | ORAL | 0 refills | Status: DC

## 2018-01-14 NOTE — Telephone Encounter (Signed)
I called pt. She had been having intermittent episodes of vertigo that started this past Saturday. She also was vomiting Sat/Sun. This has resolved. Last episode of vertigo this morning at 4am. Zofran ineffective. Spoke with Dr. Epimenio Foot. Advised he did not feel MS related. He will provide meclizine 25mg  tablet #30 to take up to 3 per day prn instead of zofran for vertigo. We will see her on Tuesday 01/20/18 for when she comes for Tysabri infusion.  She verbalized understanding and appreciation for call.

## 2018-01-20 ENCOUNTER — Telehealth: Payer: Self-pay | Admitting: *Deleted

## 2018-01-20 DIAGNOSIS — Z0271 Encounter for disability determination: Secondary | ICD-10-CM

## 2018-01-20 NOTE — Telephone Encounter (Signed)
Received notification from [employer] that pt is enrolled in the $0 copay program for Tysabri. Balance of each month's copay for Tysabri will be covered by the program.  Given to [facility] for their records.  

## 2018-02-06 ENCOUNTER — Other Ambulatory Visit: Payer: Self-pay | Admitting: *Deleted

## 2018-02-06 ENCOUNTER — Other Ambulatory Visit: Payer: Self-pay

## 2018-02-06 MED ORDER — LEVETIRACETAM 1000 MG PO TABS: 1000.0000 mg | ORAL_TABLET | Freq: Two times a day (BID) | ORAL | 11 refills | Status: DC

## 2018-02-06 MED ORDER — AMPHETAMINE-DEXTROAMPHET ER 20 MG PO CP24: 20.0000 mg | ORAL_CAPSULE | Freq: Two times a day (BID) | ORAL | 0 refills | Status: DC

## 2018-02-23 ENCOUNTER — Other Ambulatory Visit: Payer: Self-pay | Admitting: *Deleted

## 2018-02-23 MED ORDER — CYCLOBENZAPRINE HCL 5 MG PO TABS: 5.0000 mg | ORAL_TABLET | Freq: Three times a day (TID) | ORAL | 3 refills | Status: DC | PRN

## 2018-03-06 ENCOUNTER — Telehealth: Payer: Self-pay | Admitting: *Deleted

## 2018-03-06 NOTE — Telephone Encounter (Signed)
PA approved 02/04/2018-03/06/2019. CaseId:[id number].

## 2018-03-06 NOTE — Telephone Encounter (Signed)
Submitted PA Adderall on covermymeds. Key: [id number]. Waiting on determination.  "[employer] is reviewing your PA request and will respond within 24 hours for Medicaid or up to 72 hours for non-Medicaid plans, based on the required timeframe determined by state or federal regulations".

## 2018-03-11 DIAGNOSIS — Z0271 Encounter for disability determination: Secondary | ICD-10-CM

## 2018-03-17 ENCOUNTER — Other Ambulatory Visit: Payer: Self-pay

## 2018-03-18 MED ORDER — MECLIZINE HCL 25 MG PO TABS: ORAL_TABLET | ORAL | 0 refills | Status: DC

## 2018-03-18 MED ORDER — AMPHETAMINE-DEXTROAMPHET ER 20 MG PO CP24: 20.0000 mg | ORAL_CAPSULE | Freq: Two times a day (BID) | ORAL | 0 refills | Status: DC

## 2018-04-01 ENCOUNTER — Ambulatory Visit: Payer: Managed Care, Other (non HMO) | Admitting: Neurology

## 2018-04-13 DIAGNOSIS — R569 Unspecified convulsions: Secondary | ICD-10-CM

## 2018-04-14 ENCOUNTER — Other Ambulatory Visit: Payer: Self-pay

## 2018-04-15 ENCOUNTER — Other Ambulatory Visit: Payer: Self-pay

## 2018-04-15 MED ORDER — AMPHETAMINE-DEXTROAMPHET ER 20 MG PO CP24: 20.0000 mg | ORAL_CAPSULE | Freq: Two times a day (BID) | ORAL | 0 refills | Status: DC

## 2018-04-15 NOTE — Telephone Encounter (Signed)
[place] Database Verified LR: 03-20-2018 Qty: 60 Pending appointment: 05-18-2018

## 2018-04-16 MED ORDER — AMPHETAMINE-DEXTROAMPHET ER 20 MG PO CP24: 20.0000 mg | ORAL_CAPSULE | Freq: Two times a day (BID) | ORAL | 0 refills | Status: DC

## 2018-04-17 ENCOUNTER — Other Ambulatory Visit: Payer: Self-pay | Admitting: *Deleted

## 2018-04-17 DIAGNOSIS — R569 Unspecified convulsions: Secondary | ICD-10-CM

## 2018-04-23 ENCOUNTER — Ambulatory Visit: Payer: Managed Care, Other (non HMO) | Admitting: Neurology

## 2018-04-23 DIAGNOSIS — R569 Unspecified convulsions: Secondary | ICD-10-CM | POA: Diagnosis not present

## 2018-04-23 NOTE — Progress Notes (Signed)
   [facility]  EEG (ELECTROENCEPHALOGRAM) REPORT   STUDY DATE: 04/23/2018 PATIENT NAME: Paula Anderson DOB: Apr 21, 1975 MRN: [id number]  ORDERING CLINICIAN: Coralie Stanke A. Epimenio Foot, MD. PhD  TECHNOLOGIST: Elvis Coil, RPSGT TECHNIQUE: Electroencephalogram was recorded utilizing standard 10-20 system of lead placement and reformatted into average and bipolar montages.  RECORDING TIME: 27 minutes and 14 seconds  CLINICAL INFORMATION: 43 year old woman with multiple sclerosis and seizures  FINDINGS: A digital EEG was performed while the patient was awake and drowsy. While awake and most alert there was an 11 hz posterior dominant rhythm. Voltages and frequencies were symmetric.  There were no focal, lateralizing, epileptiform activity or seizures seen.  Photic stimulation had a normal driving response. Hyperventilation and recovery did not change the underlying rhythms. EKG channel shows NSR.   She became drowsy on one occasion but no definitive sleep was recorded..  IMPRESSION: This is a normal EEG while the patient was awake and drowsy.   INTERPRETING PHYSICIAN:   Crytal Pensinger A. Epimenio Foot, MD, PhD, [facility] Certified in Neurology, Clinical Neurophysiology, Sleep Medicine, Pain Medicine and Neuroimaging  [facility] [address], [address] [place], [place] [address] [phone]

## 2018-04-24 ENCOUNTER — Telehealth: Payer: Self-pay | Admitting: *Deleted

## 2018-04-24 NOTE — Telephone Encounter (Signed)
I was able to speak the patient.  She is aware of her EEG results.

## 2018-04-24 NOTE — Telephone Encounter (Signed)
-----   Message from Asa Lente, MD sent at 04/23/2018  6:18 PM EST ----- Please let her know that the EEG did not show any seizure activity.

## 2018-05-12 ENCOUNTER — Other Ambulatory Visit: Payer: Self-pay

## 2018-05-12 MED ORDER — AMPHETAMINE-DEXTROAMPHET ER 20 MG PO CP24: 20.0000 mg | ORAL_CAPSULE | Freq: Two times a day (BID) | ORAL | 0 refills | Status: DC

## 2018-05-18 ENCOUNTER — Other Ambulatory Visit: Payer: Managed Care, Other (non HMO)

## 2018-05-27 ENCOUNTER — Telehealth: Payer: Self-pay | Admitting: Neurology

## 2018-05-27 NOTE — Telephone Encounter (Signed)
pt has called for the [facility] suite, call transferred °

## 2018-06-03 ENCOUNTER — Telehealth: Payer: Self-pay | Admitting: *Deleted

## 2018-06-03 NOTE — Telephone Encounter (Signed)
Called, LVM for pt to call back. I received request that she would like to schedule f/u for her MS with Dr. Epimenio Foot.   If she calls, please offer to set up virtual visit on 06/08/18 with Dr. Epimenio Foot

## 2018-06-09 NOTE — Telephone Encounter (Signed)
Called pt. She agreed to do virtual visit with Dr. Epimenio Foot 06/11/18 at 3pm with Dr. Epimenio Foot for her 6 month f/u. I scheduled/emailed pt. Email: [email or web address] .Advised her to call back if she does not receive email. Updated med list/pharmacy/allergy list.

## 2018-06-11 ENCOUNTER — Ambulatory Visit (INDEPENDENT_AMBULATORY_CARE_PROVIDER_SITE_OTHER): Payer: Managed Care, Other (non HMO) | Admitting: Neurology

## 2018-06-11 ENCOUNTER — Other Ambulatory Visit: Payer: Self-pay

## 2018-06-11 ENCOUNTER — Encounter: Payer: Self-pay | Admitting: Neurology

## 2018-06-11 DIAGNOSIS — G4489 Other headache syndrome: Secondary | ICD-10-CM

## 2018-06-11 DIAGNOSIS — G40219 Localization-related (focal) (partial) symptomatic epilepsy and epileptic syndromes with complex partial seizures, intractable, without status epilepticus: Secondary | ICD-10-CM | POA: Diagnosis not present

## 2018-06-11 DIAGNOSIS — R5383 Other fatigue: Secondary | ICD-10-CM | POA: Diagnosis not present

## 2018-06-11 DIAGNOSIS — G35 Multiple sclerosis: Secondary | ICD-10-CM

## 2018-06-11 DIAGNOSIS — R4189 Other symptoms and signs involving cognitive functions and awareness: Secondary | ICD-10-CM

## 2018-06-11 DIAGNOSIS — Z79899 Other long term (current) drug therapy: Secondary | ICD-10-CM

## 2018-06-11 MED ORDER — AMPHETAMINE-DEXTROAMPHET ER 25 MG PO CP24: 25.0000 mg | ORAL_CAPSULE | Freq: Two times a day (BID) | ORAL | 0 refills | Status: DC

## 2018-06-11 NOTE — Progress Notes (Signed)
Marland Kitchen.   [facility]  PATIENT: Paula Anderson DOB: August 19, 1975  REFERRING DOCTOR OR PCP:  Delbert HarnessKim Briscoe    Fax [phone] ______________________________   HISTORICAL  CHIEF COMPLAINT:  Chief Complaint  Patient presents with   Multiple Sclerosis    On Tysabri   Seizures    HISTORY OF PRESENT ILLNESS:  Paula Anderson is a 43 y.o. woman with multiple sclerosis and seizures.    Update 06/11/2018: Virtual Visit via Video Note I connected with Paula Anderson on 06/11/18 at  3:00 PM EDT by a video enabled telemedicine application and verified that I am speaking with the correct person.  I discussed the limitations of evaluation and management by telemedicine and the availability of in person appointments. The patient expressed understanding and agreed to proceed.  History of Present Illness: She feels her MS is stable.   She is on Tysabri and tolerates it well.   She denies exacerbations.   Gait is doing about the same   Strength is about the same.   She has mild muscle spasticity helped by Flexeril and valium She has cold sensations in her hands but no other numbness.    Bladder function is ok.  She notes reduced visual acuity but this is not new.   She is always tired and feels she could sleep 14-15 hours some days.   She is on Adderall which seemed to help her fatigue more initially.  It still helps some.   She notes reduced focus and attention and the Adderall is only helping this a little bit.       She has insomnia and takes Restoril only as couple times a week.      She denies any more seizure or spells.  EEG 04/23/2018 was normal.  She is on lamotrigine and Keppra and tolerates them well.    She has a migraine only about once a month for 1-2 days.   Rest usually helps.      Observations/Objective: She is a well-developed well-nourished woman in no acute distress.  The head is normocephalic and atraumatic.  Sclera are anicteric.  Visible skin appears normal.  The neck has a  good range of motion.  Pharynx and tongue have normal appearance.  She is alert and fully oriented with fluent speech and good attention, knowledge and memory.  Extraocular muscles are intact.  Facial strength is normal.  Palatal elevation and tongue protrusion are midline.  She appears to have normal strength in the arms.  Rapid alternating movements and finger-nose-finger are performed well.   Assessment and Plan: Multiple sclerosis (HCC)  Partial symptomatic epilepsy with complex partial seizures, intractable, without status epilepticus (HCC)  Other fatigue  Disturbed cognition  Other headache syndrome  1.   Continue Tysabri.   We need to check the JCV antibody and CBC at the next infusion.  We will check an MRI later this year to determine if she is having any any question and consider a different DMT occurring. 2.   Increase the Adderall to 25 mg twice a day gives her more benefit for fatigue and cognition. 3.   Continue patient's.  Make sure to get at least 8 hours of sleep nightly. 4.   She will return to see me in 5 months or sooner if there are new or worsening neurologic symptoms.  Follow Up Instructions: I discussed the assessment and treatment plan with the patient. The patient was provided an opportunity to ask questions and all were answered. The  patient agreed with the plan and demonstrated an understanding of the instructions.    The patient was advised to call back or seek an in-person evaluation if the symptoms worsen or if the condition fails to improve as anticipated.  I provided 25 minutes of non-face-to-face time during this encounter.  _______________ From Prior visits Update 11/28/2017: She has been enrolled in the [id number] MS study (drug is very similar to Tecfidera).   Although she has tolerated medicine well, she has had some worsening symptoms over the last year with some progression.   She is interested in switching to a different disease modifying therapy.  We  had a very long discussion about options.  As she has had some progression my recommendation would be to either start Mayzent, Tysabri or Ocrevus.  We went over the pros and cons of each of these medications.  Additionally literature was provided.  She will need to return for an early termination visit and she will think about her options between now and then.  She was tested for the JCV antibody earlier this year and was negative.  She notes that vision is worse, left more than right.  This is more in the peripheral vcision and noted while driving.   Colors may be mildly different.  She also has noted some difficulty with swallowing.    She has had some choking on food of all consistency.   She is not choking on liquids.      She also notes her gait is more off balanced and she doesnt feel safe walking with her grandchildren.     She has dropped some items.     She has not had any seizures on Lamictal with Keppra.   Update 12/16/2016:    Since her last visit, she feels her balance is worse.   She does not use a cane and can walk a mile if she needed to.   She has had a couple falls and hurt her arm the one time.    She has more right hand clumsiness and writing is worse.  She denies any new numbness or dysesthesia.   The only medication she has been on is Copaxone.   we discussed that with 2 exacerbations in the Rebif 6 months that I feel she needs to switch to a different medication and we discussed options.    She also feels the vision is worse in her right eye.    Usually the left eye is worse than the right but the right eye worsened 2 weeks ago.   Her left eye is still worse but right eye now almost as bad.     She has had several UTI which improved with Bactrim and then came back and was treated with Keflex and she thinks it may have come back.  The recurrent UTI was resistant to Bactrim by report (10/14/16) and she was placed on Keflex so we'll try another antibiotic.   She had had some depression  earlier this year and feels better on Prozac.   she feels mood is fine now.  No suicidal ideation.  Cognition is baseline.     She has not had any seizures on the new combination of Lamictal plus Keppra (added)  She has vertigo and has rarely taken Zofran but none in the past year.   Her last steroid was early August,  2018 after her last exacerbation.     Repeat Cervical spine MRI 02/03/2016 showed small non-nenhancing focus adjacent to  C7T1 in the left posterolateral spinal cord.    Repeat Brain MRI 02/03/2016 is unchanged.     ________________________________________- From 09/16/2016:  MS:   She is on copaxone and she tolerates it well.    Last week, she had the onset of bilateral hand clumsiness.   No major change in strength or sensation.   She needed help to tie her shoes.   The gait seems slightly worse.    Gait/strength/sensation: Gait is mildly wide and slightly off balanced.    She feels people look at her when she walks and someone once thought she was drunk.   The right leg is dragging more and she catches the foot on the floor when she walks.     She stumbles daily and is falling every week or two.    She often needs to grab on to the wall or furniture for balance .    She has tingling in her legs but no more pronounced numbness.     Bladder/bowel:  She notes needing to urinate x 10 most days and x 1 most nights.   This is more than last year.      Vision: She has diplopia to left gaze (old) but now notes it when she looks right (new this week)   She feels vision is very reduced on her left.     She notes asymmetry with brightness and color.       Fatigue/sleep:  She has physical and mental fatigue.  Adderall XR 30 has helped.    She has insomnia and delayed phase sllep disorder (she goes to bed around 3 am and feels she can't go to bed earlier.)   Ambien caused a hangover effect the next day.     Mood/cognition:  Mood is doing well. She denies depression or anxiety. She does have  some irritability and gets angry easily at times.     She has noted difficulty with short term memory, decreased ability to come up with the right word and decreased focus.     Seizure:  No seizures since her last visit. Earlier this year, she had a seizure lasting 5 minutes. Her previous seizure was at least a year earlier she is on Keppra plus lamotrigine.      She had an EEG in [place] that reportedly had spikes but we do not have those results at this time.       MS History:   She presented with difficulties with speech, memory and gait balance in 2013. MRIs were consistent with MS.   In 2014, she had a severe episode of vertigo and was taken to the hospital. Repeat MRI imaging was performed and she was diagnosed officially with MS. She was started on Copaxone.   She received IV steroids couple times when she had severe fatigue.     She tolerates Copaxone well.    No difficulty with skin reactions.  I personally reviewed MRI images of the brain and spine.   In the posterior fossa there is a right middle cerebellar peduncle lesion, possible medulla focus and left midbrain focus and two small cerebellar hemisphere foci. She has thalamic foci.  There are multiple periventricular, deep and juxtacortical white matter foci in the hemispheres. Some of these are oriented to the ventricles. Some are hypointense on T1-weighted images. None enhance.  C- Spine 10/19/2014 reported without plaques.  Repeat Brain MRI 02/03/2016 is unchanged.   Repeat Cervical spine MRI 02/03/2016 shows small no-nenhancing focus adjacent  to C7T1 in the left posterolateral spinal cord  REVIEW OF SYSTEMS: Constitutional: No fevers, chills, sweats, or change in appetite.  She has fatigue and insomnia Eyes: as above Ear, nose and throat: No hearing loss, ear pain, nasal congestion, sore throat.   She has vertigo. Cardiovascular: No chest pain, palpitations Respiratory: No shortness of breath at rest or with exertion.   No  wheezes GastrointestinaI: No nausea, vomiting, diarrhea, abdominal pain, fecal incontinence Genitourinal:   She has had multiple UTIs.  Musculoskeletal: No neck pain.  She  notesback pain Integumentary: No rash, pruritus, skin lesions Neurological: as above Psychiatric: Notes mild depression at this time.  No anxiety Endocrine: No palpitations, diaphoresis, change in appetite, change in weigh or increased thirst Hematologic/Lymphatic: No anemia, purpura, petechiae.   She feels that she bruises easily. Allergic/Immunologic: No itchy/runny eyes, nasal congestion, recent allergic reactions, rashes  ALLERGIES: Allergies  Allergen Reactions   Amoxicillin Shortness Of Breath   Penicillins Shortness Of Breath    Skin turns red   Bupropion Other (See Comments)    Mood changes    HOME MEDICATIONS:  Current Outpatient Medications:    albuterol (PROVENTIL) (2.5 MG/3ML) 0.083% nebulizer solution, Take 2.5 mg by nebulization every 6 (six) hours as needed for wheezing or shortness of breath., Disp: , Rfl:    amphetamine-dextroamphetamine (ADDERALL XR) 20 MG 24 hr capsule, Take 1 capsule (20 mg total) by mouth 2 (two) times daily., Disp: 60 capsule, Rfl: 0   cyclobenzaprine (FLEXERIL) 5 MG tablet, Take 1 tablet (5 mg total) by mouth 3 (three) times daily as needed for muscle spasms., Disp: 90 tablet, Rfl: 3   diazepam (VALIUM) 5 MG tablet, 1-2 tablets prior to flight, Disp: , Rfl:    Fluticasone Furoate-Vilanterol (BREO ELLIPTA) 100-25 MCG/INH AEPB, Inhale 1 puff into the lungs. , Disp: , Rfl:    lamoTRIgine (LAMICTAL) 100 MG tablet, Take 3 tablets (300 mg total) by mouth 2 (two) times daily., Disp: 540 tablet, Rfl: 3   levETIRAcetam (KEPPRA) 1000 MG tablet, Take 1 tablet (1,000 mg total) by mouth 2 (two) times daily., Disp: 60 tablet, Rfl: 11   meclizine (ANTIVERT) 25 MG tablet, Take up to 3 tablets per day as needed for vertigo., Disp: 30 tablet, Rfl: 0   naproxen (NAPROSYN) 500 MG  tablet, Take 500 mg by mouth 2 (two) times daily with a meal., Disp: , Rfl:    Natalizumab (TYSABRI IV), Inject into the vein., Disp: , Rfl:    temazepam (RESTORIL) 15 MG capsule, Take 1 capsule (15 mg total) by mouth at bedtime as needed for sleep., Disp: 30 capsule, Rfl: 3   Vitamin D, Ergocalciferol, (DRISDOL) 50000 units CAPS capsule, Take 1 capsule (50,000 Units total) by mouth every 7 (seven) days., Disp: 12 capsule, Rfl: 1  PAST MEDICAL HISTORY: Past Medical History:  Diagnosis Date   Chronic headaches    Multiple sclerosis (HCC)    Seizures (HCC)     PAST SURGICAL HISTORY: Past Surgical History:  Procedure Laterality Date   ABDOMINAL SURGERY     COSMETIC SURGERY      FAMILY HISTORY: Family History  Problem Relation Age of Onset   Cancer Father    Heart failure Paternal Grandmother     SOCIAL HISTORY:  Social History   Socioeconomic History   Marital status: Married    Spouse name: Tejal Monroy   Number of children: 2   Years of education: 12   Highest education level: Not on file  Occupational History  Occupation: unemployeed  Ecologist strain: Not on file   Food insecurity:    Worry: Not on file    Inability: Not on file   Transportation needs:    Medical: Not on file    Non-medical: Not on file  Tobacco Use   Smoking status: Former Smoker   Smokeless tobacco: Never Used   Tobacco comment: quit 2010  Substance and Sexual Activity   Alcohol use: No    Alcohol/week: 0.0 standard drinks   Drug use: No   Sexual activity: Yes    Partners: Male  Lifestyle   Physical activity:    Days per week: Not on file    Minutes per session: Not on file   Stress: Not on file  Relationships   Social connections:    Talks on phone: Not on file    Gets together: Not on file    Attends religious service: Not on file    Active member of club or organization: Not on file    Attends meetings of clubs or organizations:  Not on file    Relationship status: Not on file   Intimate partner violence:    Fear of current or ex partner: Not on file    Emotionally abused: Not on file    Physically abused: Not on file    Forced sexual activity: Not on file  Other Topics Concern   Not on file  Social History Narrative   Lives at home with husband and children   Drinks caffeine: 2 cups a day     PHYSICAL EXAM  There were no vitals filed for this visit.  There is no height or weight on file to calculate BMI.   General: The patient is well-developed and well-nourished and in no acute distress    Eyes:  Visual acuity is 20/100 OS and 20/70 OD.      Neurologic Exam  Mental status: The patient is alert and oriented x 3 at the time of the examination. The patient has apparent normal recent and remote memory, with an apparently normal attention span and concentration ability.   Speech is normal.  Cranial nerves: She has nystagmus on lateral gaze...  Facial symmetry is present. Facial strength and sensation is normal. Trapezius strength is normal.. No dysarthria is noted.  The tongue is midline, and the patient has symmetric elevation of the soft palate. No obvious hearing deficits are noted.  Motor:  Muscle bulk is normal.   Muscle tone is slightly increased in legs.  Strength is 5/5 except for plus/5 strength in the right foot  Sensory: She has reduced sensation on the right side..     Coordination:   Finger-nose-finger and heel-to-shin is reduced more on the right.  Rapid altering movements for slightly reduced in the right hand.  Gait and station: Station is normal.  The gait is mildly wide and the tandem gait is wide.  She does not need to use support.   She has a mild right foot drop.  Romberg is negative.   Reflexes: Deep tendon reflexes are symmetric and increased (spread at knees) bilaterally.  DTRs are higher on the right leg and left leg.    DIAGNOSTIC DATA (LABS, IMAGING, TESTING) - I reviewed  patient records, labs, notes, testing and imaging myself where available.     ASSESSMENT AND PLAN  Multiple sclerosis (HCC)  Partial symptomatic epilepsy with complex partial seizures, intractable, without status epilepticus (HCC)  Other fatigue  Disturbed  cognition  Other headache syndrome     Shanetta Nicolls A. Epimenio Foot, MD, PhD, [place] 06/11/2018, 3:29 PM Certified in Neurology, Clinical Neurophysiology, Sleep Medicine, Pain Medicine and Neuroimaging  [facility] [address] [place], [place] [address] [phone]

## 2018-07-12 ENCOUNTER — Other Ambulatory Visit: Payer: Self-pay

## 2018-07-14 MED ORDER — AMPHETAMINE-DEXTROAMPHET ER 25 MG PO CP24: 25.0000 mg | ORAL_CAPSULE | Freq: Two times a day (BID) | ORAL | 0 refills | Status: DC

## 2018-07-14 NOTE — Telephone Encounter (Signed)
[facility] Verified LR: 06/16/2018 Qty: 60 Pending appointment: 12/15/2018

## 2018-07-21 ENCOUNTER — Other Ambulatory Visit: Payer: Self-pay | Admitting: Neurology

## 2018-07-21 MED ORDER — SULFAMETHOXAZOLE-TRIMETHOPRIM 800-160 MG PO TABS: 1.0000 | ORAL_TABLET | Freq: Two times a day (BID) | ORAL | 0 refills | Status: DC

## 2018-07-29 ENCOUNTER — Other Ambulatory Visit: Payer: Self-pay | Admitting: *Deleted

## 2018-07-29 DIAGNOSIS — N3000 Acute cystitis without hematuria: Secondary | ICD-10-CM

## 2018-07-30 ENCOUNTER — Other Ambulatory Visit: Payer: Self-pay

## 2018-08-03 ENCOUNTER — Other Ambulatory Visit: Payer: Self-pay | Admitting: Neurology

## 2018-08-03 MED ORDER — ONDANSETRON HCL 8 MG PO TABS: 8.0000 mg | ORAL_TABLET | Freq: Three times a day (TID) | ORAL | 1 refills | Status: DC | PRN

## 2018-08-05 ENCOUNTER — Ambulatory Visit (INDEPENDENT_AMBULATORY_CARE_PROVIDER_SITE_OTHER): Payer: Managed Care, Other (non HMO) | Admitting: Neurology

## 2018-08-05 ENCOUNTER — Telehealth: Payer: Self-pay | Admitting: *Deleted

## 2018-08-05 ENCOUNTER — Encounter: Payer: Self-pay | Admitting: Neurology

## 2018-08-05 ENCOUNTER — Other Ambulatory Visit: Payer: Self-pay

## 2018-08-05 VITALS — BP 90/60 | HR 94 | Temp 97.7°F | Ht 62.0 in | Wt 121.5 lb

## 2018-08-05 DIAGNOSIS — G35 Multiple sclerosis: Secondary | ICD-10-CM | POA: Diagnosis not present

## 2018-08-05 DIAGNOSIS — F419 Anxiety disorder, unspecified: Secondary | ICD-10-CM

## 2018-08-05 DIAGNOSIS — F329 Major depressive disorder, single episode, unspecified: Secondary | ICD-10-CM

## 2018-08-05 DIAGNOSIS — N390 Urinary tract infection, site not specified: Secondary | ICD-10-CM

## 2018-08-05 DIAGNOSIS — F32A Depression, unspecified: Secondary | ICD-10-CM

## 2018-08-05 DIAGNOSIS — R4189 Other symptoms and signs involving cognitive functions and awareness: Secondary | ICD-10-CM

## 2018-08-05 DIAGNOSIS — G40219 Localization-related (focal) (partial) symptomatic epilepsy and epileptic syndromes with complex partial seizures, intractable, without status epilepticus: Secondary | ICD-10-CM

## 2018-08-05 DIAGNOSIS — R260 Ataxic gait: Secondary | ICD-10-CM | POA: Diagnosis not present

## 2018-08-05 DIAGNOSIS — R5383 Other fatigue: Secondary | ICD-10-CM

## 2018-08-05 MED ORDER — NITROFURANTOIN MACROCRYSTAL 100 MG PO CAPS: 100.0000 mg | ORAL_CAPSULE | Freq: Three times a day (TID) | ORAL | 0 refills | Status: DC

## 2018-08-05 MED ORDER — PREDNISONE 50 MG PO TABS: ORAL_TABLET | ORAL | 0 refills | Status: DC

## 2018-08-05 NOTE — Progress Notes (Addendum)
Marland Kitchen.   [facility]  PATIENT: Paula Anderson DOB: 1975/04/27  REFERRING DOCTOR OR PCP:  Delbert HarnessKim Briscoe    Fax [phone] ______________________________   HISTORICAL  CHIEF COMPLAINT:  Chief Complaint  Patient presents with   Follow-up    RM 12. Last seen 06/11/2018.    HISTORY OF PRESENT ILLNESS:  Paula Anderson is a 43 y.o. woman with relapsing remitting multiple sclerosis and seizures.    Update 08/05/2018: She started to have vertigo 2 days ago and then felt weaker and more off balanced.  Her right side seems clumsier.  She had trouble holding a bowl of cereal.    Her last Tysabri was a month ago.    She switched to Tysabri form [id number] (Vumerity) December 2019 and is tolerating it well.   She feels strength is a little worse on the right.  She notes a little bit more numbness in the hands.  A few days ago, she had more severe urinary urgency and noted changes in his urine but no burning.  For a probable UTI she was treated with Bactrim but has not noted a benefit.    She is still having frequency and urine is darker.      She is noting more fatigue the last week.  Due to the urinary frequency she is waking up some at night.  Mood is doing about the same.  She is feeling more tired the last week, despite Adderall.   She has no new seizures since a seizure in February 2020.   EEG was fine 3/5/20202.  She is on Keppra and lamotrigine and tolerates them well.   Update 06/11/2018: Virtual Visit via Video Note History of Present Illness: She feels her MS is stable.   She is on Tysabri and tolerates it well.   She denies exacerbations.   Gait is doing about the same   Strength is about the same.   She has mild muscle spasticity helped by Flexeril and valium She has cold sensations in her hands but no other numbness.    Bladder function is ok.  She notes reduced visual acuity but this is not new.   She is always tired and feels she could sleep 14-15 hours some days.   She is  on Adderall which seemed to help her fatigue more initially.  It still helps some.   She notes reduced focus and attention and the Adderall is only helping this a little bit.       She has insomnia and takes Restoril only as couple times a week.      She denies any more seizure or spells.  EEG 04/23/2018 was normal.  She is on lamotrigine and Keppra and tolerates them well.    She has a migraine only about once a month for 1-2 days.   Rest usually helps.      Observations/Objective: She is a well-developed well-nourished woman in no acute distress.  The head is normocephalic and atraumatic.  Sclera are anicteric.  Visible skin appears normal.  The neck has a good range of motion.  Pharynx and tongue have normal appearance.  She is alert and fully oriented with fluent speech and good attention, knowledge and memory.  Extraocular muscles are intact.  Facial strength is normal.  Palatal elevation and tongue protrusion are midline.  She appears to have normal strength in the arms.  Rapid alternating movements and finger-nose-finger are performed well.   Assessment and Plan:   1.  Continue Tysabri.   We need to check the JCV antibody and CBC at the next infusion.  We will check an MRI later this year to determine if she is having any any question and consider a different DMT occurring. 2.   Increase the Adderall to 25 mg twice a day gives her more benefit for fatigue and cognition. 3.   Continue patient's.  Make sure to get at least 8 hours of sleep nightly. 4.   She will return to see me in 5 months or sooner if there are new or worsening neurologic symptoms. _______________ Update 11/28/2017: She has been enrolled in the [id number] MS study (drug is very similar to Tecfidera).   Although she has tolerated medicine well, she has had some worsening symptoms over the last year with some progression.   She is interested in switching to a different disease modifying therapy.  We had a very long discussion about  options.  As she has had some progression my recommendation would be to either start Mayzent, Tysabri or Ocrevus.  We went over the pros and cons of each of these medications.  Additionally literature was provided.  She will need to return for an early termination visit and she will think about her options between now and then.  She was tested for the JCV antibody earlier this year and was negative.  She notes that vision is worse, left more than right.  This is more in the peripheral vcision and noted while driving.   Colors may be mildly different.  She also has noted some difficulty with swallowing.    She has had some choking on food of all consistency.   She is not choking on liquids.      She also notes her gait is more off balanced and she doesnt feel safe walking with her grandchildren.     She has dropped some items.     She has not had any seizures on Lamictal with Keppra.   Update 12/16/2016:    Since her last visit, she feels her balance is worse.   She does not use a cane and can walk a mile if she needed to.   She has had a couple falls and hurt her arm the one time.    She has more right hand clumsiness and writing is worse.  She denies any new numbness or dysesthesia.   The only medication she has been on is Copaxone.   we discussed that with 2 exacerbations in the Rebif 6 months that I feel she needs to switch to a different medication and we discussed options.    She also feels the vision is worse in her right eye.    Usually the left eye is worse than the right but the right eye worsened 2 weeks ago.   Her left eye is still worse but right eye now almost as bad.     She has had several UTI which improved with Bactrim and then came back and was treated with Keflex and she thinks it may have come back.  The recurrent UTI was resistant to Bactrim by report (10/14/16) and she was placed on Keflex so we'll try another antibiotic.   She had had some depression earlier this year and feels  better on Prozac.   she feels mood is fine now.  No suicidal ideation.  Cognition is baseline.     She has not had any seizures on the new combination of Lamictal plus Keppra (  added)  She has vertigo and has rarely taken Zofran but none in the past year.   Her last steroid was early August,  2018 after her last exacerbation.     Repeat Cervical spine MRI 02/03/2016 showed small non-nenhancing focus adjacent to C7T1 in the left posterolateral spinal cord.    Repeat Brain MRI 02/03/2016 is unchanged.     ________________________________________- From 09/16/2016:  MS:   She is on copaxone and she tolerates it well.    Last week, she had the onset of bilateral hand clumsiness.   No major change in strength or sensation.   She needed help to tie her shoes.   The gait seems slightly worse.    Gait/strength/sensation: Gait is mildly wide and slightly off balanced.    She feels people look at her when she walks and someone once thought she was drunk.   The right leg is dragging more and she catches the foot on the floor when she walks.     She stumbles daily and is falling every week or two.    She often needs to grab on to the wall or furniture for balance .    She has tingling in her legs but no more pronounced numbness.     Bladder/bowel:  She notes needing to urinate x 10 most days and x 1 most nights.   This is more than last year.      Vision: She has diplopia to left gaze (old) but now notes it when she looks right (new this week)   She feels vision is very reduced on her left.     She notes asymmetry with brightness and color.       Fatigue/sleep:  She has physical and mental fatigue.  Adderall XR 30 has helped.    She has insomnia and delayed phase sllep disorder (she goes to bed around 3 am and feels she can't go to bed earlier.)   Ambien caused a hangover effect the next day.     Mood/cognition:  Mood is doing well. She denies depression or anxiety. She does have some irritability and gets  angry easily at times.     She has noted difficulty with short term memory, decreased ability to come up with the right word and decreased focus.     Seizure:  No seizures since her last visit. Earlier this year, she had a seizure lasting 5 minutes. Her previous seizure was at least a year earlier she is on Keppra plus lamotrigine.      She had an EEG in [place] that reportedly had spikes but we do not have those results at this time.       MS History:   She presented with difficulties with speech, memory and gait balance in 2013. MRIs were consistent with MS.   In 2014, she had a severe episode of vertigo and was taken to the hospital. Repeat MRI imaging was performed and she was diagnosed officially with MS. She was started on Copaxone.   She received IV steroids couple times when she had severe fatigue.     She tolerates Copaxone well.    No difficulty with skin reactions.  I personally reviewed MRI images of the brain and spine.   In the posterior fossa there is a right middle cerebellar peduncle lesion, possible medulla focus and left midbrain focus and two small cerebellar hemisphere foci. She has thalamic foci.  There are multiple periventricular, deep and juxtacortical white matter foci  in the hemispheres. Some of these are oriented to the ventricles. Some are hypointense on T1-weighted images. None enhance.  C- Spine 10/19/2014 reported without plaques.  Repeat Brain MRI 02/03/2016 is unchanged.   Repeat Cervical spine MRI 02/03/2016 shows small no-nenhancing focus adjacent to C7T1 in the left posterolateral spinal cord  REVIEW OF SYSTEMS: Constitutional: No fevers, chills, sweats, or change in appetite.  She has fatigue and insomnia Eyes: as above Ear, nose and throat: No hearing loss, ear pain, nasal congestion, sore throat.   She has vertigo. Cardiovascular: No chest pain, palpitations Respiratory: No shortness of breath at rest or with exertion.   No wheezes GastrointestinaI: No nausea,  vomiting, diarrhea, abdominal pain, fecal incontinence Genitourinal:   She has had multiple UTIs.  Musculoskeletal: No neck pain.  She  notesback pain Integumentary: No rash, pruritus, skin lesions Neurological: as above Psychiatric: Notes mild depression at this time.  No anxiety Endocrine: No palpitations, diaphoresis, change in appetite, change in weigh or increased thirst Hematologic/Lymphatic: No anemia, purpura, petechiae.   She feels that she bruises easily. Allergic/Immunologic: No itchy/runny eyes, nasal congestion, recent allergic reactions, rashes  ALLERGIES: Allergies  Allergen Reactions   Amoxicillin Shortness Of Breath   Penicillins Shortness Of Breath    Skin turns red   Bupropion Other (See Comments)    Mood changes    HOME MEDICATIONS:  Current Outpatient Medications:    albuterol (PROVENTIL) (2.5 MG/3ML) 0.083% nebulizer solution, Take 2.5 mg by nebulization every 6 (six) hours as needed for wheezing or shortness of breath., Disp: , Rfl:    amphetamine-dextroamphetamine (ADDERALL XR) 25 MG 24 hr capsule, Take 1 capsule by mouth 2 (two) times daily., Disp: 60 capsule, Rfl: 0   cyclobenzaprine (FLEXERIL) 5 MG tablet, Take 1 tablet (5 mg total) by mouth 3 (three) times daily as needed for muscle spasms., Disp: 90 tablet, Rfl: 3   diazepam (VALIUM) 5 MG tablet, 1-2 tablets prior to flight, Disp: , Rfl:    Fluticasone Furoate-Vilanterol (BREO ELLIPTA) 100-25 MCG/INH AEPB, Inhale 1 puff into the lungs. , Disp: , Rfl:    lamoTRIgine (LAMICTAL) 100 MG tablet, Take 3 tablets (300 mg total) by mouth 2 (two) times daily., Disp: 540 tablet, Rfl: 3   levETIRAcetam (KEPPRA) 1000 MG tablet, Take 1 tablet (1,000 mg total) by mouth 2 (two) times daily., Disp: 60 tablet, Rfl: 11   meclizine (ANTIVERT) 25 MG tablet, Take up to 3 tablets per day as needed for vertigo., Disp: 30 tablet, Rfl: 0   naproxen (NAPROSYN) 500 MG tablet, Take 500 mg by mouth 2 (two) times daily with  a meal., Disp: , Rfl:    Natalizumab (TYSABRI IV), Inject into the vein., Disp: , Rfl:    ondansetron (ZOFRAN) 8 MG tablet, Take 1 tablet (8 mg total) by mouth every 8 (eight) hours as needed for nausea or vomiting., Disp: 30 tablet, Rfl: 1   sulfamethoxazole-trimethoprim (BACTRIM DS) 800-160 MG tablet, Take by mouth., Disp: , Rfl:    temazepam (RESTORIL) 15 MG capsule, Take 1 capsule (15 mg total) by mouth at bedtime as needed for sleep., Disp: 30 capsule, Rfl: 3   Vitamin D, Ergocalciferol, (DRISDOL) 50000 units CAPS capsule, Take 1 capsule (50,000 Units total) by mouth every 7 (seven) days., Disp: 12 capsule, Rfl: 1  PAST MEDICAL HISTORY: Past Medical History:  Diagnosis Date   Chronic headaches    Multiple sclerosis ([place])    Seizures ([place])     PAST SURGICAL HISTORY: Past Surgical History:  Procedure Laterality Date   ABDOMINAL SURGERY     COSMETIC SURGERY      FAMILY HISTORY: Family History  Problem Relation Age of Onset   Cancer Father    Heart failure Paternal Grandmother     SOCIAL HISTORY:  Social History   Socioeconomic History   Marital status: Married    Spouse name: Shaquon Gropp   Number of children: 2   Years of education: 12   Highest education level: Not on file  Occupational History   Occupation: unemployeed  Ecologistocial Needs   Financial resource strain: Not on file   Food insecurity    Worry: Not on file    Inability: Not on file   Transportation needs    Medical: Not on file    Non-medical: Not on file  Tobacco Use   Smoking status: Former Smoker   Smokeless tobacco: Never Used   Tobacco comment: quit 2010  Substance and Sexual Activity   Alcohol use: No    Alcohol/week: 0.0 standard drinks   Drug use: No   Sexual activity: Yes    Partners: Male  Lifestyle   Physical activity    Days per week: Not on file    Minutes per session: Not on file   Stress: Not on file  Relationships   Social connections    Talks on  phone: Not on file    Gets together: Not on file    Attends religious service: Not on file    Active member of club or organization: Not on file    Attends meetings of clubs or organizations: Not on file    Relationship status: Not on file   Intimate partner violence    Fear of current or ex partner: Not on file    Emotionally abused: Not on file    Physically abused: Not on file    Forced sexual activity: Not on file  Other Topics Concern   Not on file  Social History Narrative   Lives at home with husband and children   Drinks caffeine: 2 cups a day     PHYSICAL EXAM  Vitals:   08/05/18 1259  BP: 90/60  Pulse: 94  Temp: 97.7 F (36.5 C)  SpO2: 97%  Weight: 121 lb 8 oz (55.1 kg)  Height: 5\' 2"  (1.575 m)    Body mass index is 22.22 kg/m.   General: The patient is well-developed and well-nourished and in no acute distress    Eyes:  Visual acuity is 20/100 OS and 20/70 OD.      Neurologic Exam  Mental status: The patient is alert and oriented x 3 at the time of the examination. The patient has apparent normal recent and remote memory, with an apparently normal attention span and concentration ability.   Speech is normal.  Cranial nerves: She has nystagmus on lateral gaze...  Facial symmetry is present. Facial strength and sensation is normal. Trapezius strength is normal.. No dysarthria is noted.  The tongue is midline, and the patient has symmetric elevation of the soft palate. No obvious hearing deficits are noted.  Motor:  Muscle bulk is normal.   Muscle tone is slightly increased in legs.  Strength is 5/5 except for plus/5 strength in the right foot  Sensory: She has reduced sensation on the right side..     Coordination:   Finger-nose-finger and heel-to-shin is reduced.  Rapid altering movements were performed a little better on the left than the right.  Gait and station: Station  is normal.  The gait is mildly wide and the tandem gait is wide. She does not  need support.   She has a mild right foot drop.  Romberg is negative.   Reflexes: Deep tendon reflexes are symmetric and increased (spread at knees) bilaterally.  DTRs are higher on the right leg and left leg.    ASSESSMENT AND PLAN  1. Multiple sclerosis (HCC)   2. Partial symptomatic epilepsy with complex partial seizures, intractable, without status epilepticus (HCC)   3. Urinary tract infection without hematuria, site unspecified   4. Ataxic gait   5. Other fatigue   6. Disturbed cognition   7. Anxiety and depression    1.   Continue Tysabri for relapsing remitting MS.  However, due to the possible exacerbation we will check an MRI of the brain with and without contrast and also have her do a high-dose oral steroid taper (started with 500 mg prednisone).  Also have to recheck the JCV antibody. 2.   Stop Bactrim and start Macrodantin.  Additionally check urinalysis and urine culture.  We will check a CBC with differential and CMP as well. 3.   Try to stay active and exercise as tolerated. 4.   She will return to see me as scheduled or sooner if there are new or worsening neurologic symptoms.    Dorrene Bently A. Epimenio FootSater, MD, PhD, Larene BeachFAAN 08/05/2018, 1:03 PM Certified in Neurology, Clinical Neurophysiology, Sleep Medicine, Pain Medicine and Neuroimaging  [facility] [address], [address] [place], [place] [address] [phone]

## 2018-08-05 NOTE — Telephone Encounter (Signed)
Placed JCV lab in [facility] lock box for routine lab pick up. Results pending. 

## 2018-08-06 ENCOUNTER — Telehealth: Payer: Self-pay | Admitting: Neurology

## 2018-08-06 LAB — COMPREHENSIVE METABOLIC PANEL
ALT: 24 IU/L (ref 0–32)
AST: 20 IU/L (ref 0–40)
Albumin/Globulin Ratio: 2 (ref 1.2–2.2)
Albumin: 4.8 g/dL (ref 3.8–4.8)
Alkaline Phosphatase: 96 IU/L (ref 39–117)
BUN/Creatinine Ratio: 13 (ref 9–23)
BUN: 13 mg/dL (ref 6–24)
Bilirubin Total: 0.6 mg/dL (ref 0.0–1.2)
CO2: 23 mmol/L (ref 20–29)
Calcium: 10.4 mg/dL — ABNORMAL HIGH (ref 8.7–10.2)
Chloride: 98 mmol/L (ref 96–106)
Creatinine, Ser: 1 mg/dL (ref 0.57–1.00)
GFR calc Af Amer: 79 mL/min/{1.73_m2} (ref >59)
GFR calc non Af Amer: 68 mL/min/{1.73_m2} (ref >59)
Globulin, Total: 2.4 g/dL (ref 1.5–4.5)
Glucose: 89 mg/dL (ref 65–99)
Potassium: 5.1 mmol/L (ref 3.5–5.2)
Sodium: 138 mmol/L (ref 134–144)
Total Protein: 7.2 g/dL (ref 6.0–8.5)

## 2018-08-06 LAB — CBC WITH DIFFERENTIAL/PLATELET
Basophils Absolute: 0.1 10*3/uL (ref 0.0–0.2)
Basos: 1 %
EOS (ABSOLUTE): 0.2 10*3/uL (ref 0.0–0.4)
Eos: 3 %
Hematocrit: 42 % (ref 34.0–46.6)
Hemoglobin: 14.3 g/dL (ref 11.1–15.9)
Immature Grans (Abs): 0 10*3/uL (ref 0.0–0.1)
Immature Granulocytes: 0 %
Lymphocytes Absolute: 2.2 10*3/uL (ref 0.7–3.1)
Lymphs: 35 %
MCH: 29.9 pg (ref 26.6–33.0)
MCHC: 34 g/dL (ref 31.5–35.7)
MCV: 88 fL (ref 79–97)
Monocytes Absolute: 0.6 10*3/uL (ref 0.1–0.9)
Monocytes: 9 %
NRBC: 1 % — ABNORMAL HIGH (ref 0–0)
Neutrophils Absolute: 3.2 10*3/uL (ref 1.4–7.0)
Neutrophils: 52 %
Platelets: 315 10*3/uL (ref 150–450)
RBC: 4.79 x10E6/uL (ref 3.77–5.28)
RDW: 12.8 % (ref 11.7–15.4)
WBC: 6.3 10*3/uL (ref 3.4–10.8)

## 2018-08-06 LAB — MICROSCOPIC EXAMINATION
Casts: NONE SEEN /lpf
Epithelial Cells (non renal): >10 /hpf — AB (ref 0–10)
RBC, Urine: NONE SEEN /hpf (ref 0–2)

## 2018-08-06 LAB — URINALYSIS, ROUTINE W REFLEX MICROSCOPIC
Bilirubin, UA: NEGATIVE
Glucose, UA: NEGATIVE
Ketones, UA: NEGATIVE
Nitrite, UA: POSITIVE — AB
Protein,UA: NEGATIVE
RBC, UA: NEGATIVE
Specific Gravity, UA: 1.014 (ref 1.005–1.030)
Urobilinogen, Ur: 0.2 mg/dL (ref 0.2–1.0)
pH, UA: 6.5 (ref 5.0–7.5)

## 2018-08-06 NOTE — Telephone Encounter (Signed)
Pharmacist Tristen called to verify the dosage on pt's ST:[id number] (DELTASONE) 50 MG tablet please call her

## 2018-08-06 NOTE — Telephone Encounter (Signed)
Called back. Verified high dose is correct, for MS exacerbation. She verbalized understanding, nothing further needed.

## 2018-08-07 LAB — URINE CULTURE

## 2018-08-10 ENCOUNTER — Telehealth: Payer: Self-pay | Admitting: Neurology

## 2018-08-10 MED ORDER — CIPROFLOXACIN HCL 250 MG PO TABS: 250.0000 mg | ORAL_TABLET | Freq: Two times a day (BID) | ORAL | 0 refills | Status: DC

## 2018-08-10 NOTE — Telephone Encounter (Signed)
I called the patient.  The patient does have urinary tract infection, the bacteria is E. coli that is resistant to Bactrim which the patient was on.  I will switch the patient to Cipro, the bacteria is sensitive to this.

## 2018-08-10 NOTE — Telephone Encounter (Signed)
-----   Message from Anda Latina, RN sent at 08/10/2018  7:48 AM EDT ----- Could you review for Dr. Felecia Shelling while he is out of the office? Thanks!  ----- Message ----- From: Lavone Neri Lab Results In Sent: 08/06/2018   5:37 AM EDT To: Britt Bottom, MD

## 2018-08-10 NOTE — Telephone Encounter (Signed)
Received JCV lab result from [facility].  Index value 0.14  JCV Antibody negative.

## 2018-08-11 ENCOUNTER — Telehealth: Payer: Self-pay | Admitting: Neurology

## 2018-08-11 NOTE — Telephone Encounter (Signed)
LVM for pt to call back about scheduling Garret Reddish Auth: [id number] (exp. 08/10/18 to 02/06/19)

## 2018-08-11 NOTE — Telephone Encounter (Signed)
Patient returned my call she is scheduled for 08/18/18 at [facility].

## 2018-08-16 ENCOUNTER — Other Ambulatory Visit: Payer: Self-pay

## 2018-08-17 ENCOUNTER — Other Ambulatory Visit: Payer: Self-pay | Admitting: *Deleted

## 2018-08-17 MED ORDER — AMPHETAMINE-DEXTROAMPHET ER 25 MG PO CP24: 25.0000 mg | ORAL_CAPSULE | Freq: Two times a day (BID) | ORAL | 0 refills | Status: DC

## 2018-08-17 NOTE — Addendum Note (Signed)
Addended by: Britt Bottom on: 08/17/2018 05:34 PM   Modules accepted: Orders

## 2018-08-18 ENCOUNTER — Ambulatory Visit (INDEPENDENT_AMBULATORY_CARE_PROVIDER_SITE_OTHER): Payer: Managed Care, Other (non HMO)

## 2018-08-18 ENCOUNTER — Other Ambulatory Visit: Payer: Self-pay

## 2018-08-18 DIAGNOSIS — G35 Multiple sclerosis: Secondary | ICD-10-CM | POA: Diagnosis not present

## 2018-08-18 MED ORDER — GADOBENATE DIMEGLUMINE 529 MG/ML IV SOLN
11.0000 mL | Freq: Once | INTRAVENOUS | Status: AC | PRN
  Administered 2018-08-18: 11 mL via INTRAVENOUS

## 2018-08-20 ENCOUNTER — Telehealth: Payer: Self-pay | Admitting: *Deleted

## 2018-08-20 NOTE — Telephone Encounter (Signed)
-----   Message from Britt Bottom, MD sent at 08/19/2018  5:07 PM EDT ----- Please let her know that the MRI did not show any new MS lesions

## 2018-09-14 ENCOUNTER — Other Ambulatory Visit: Payer: Self-pay

## 2018-09-15 MED ORDER — AMPHETAMINE-DEXTROAMPHET ER 25 MG PO CP24: 25.0000 mg | ORAL_CAPSULE | Freq: Two times a day (BID) | ORAL | 0 refills | Status: DC

## 2018-09-15 NOTE — Telephone Encounter (Signed)
[place] Database Verified LR: 08-18-2018 Qty: 60 Pending appointment: 12-15-2018

## 2018-10-17 ENCOUNTER — Other Ambulatory Visit: Payer: Self-pay

## 2018-10-19 MED ORDER — AMPHETAMINE-DEXTROAMPHET ER 25 MG PO CP24: 25.0000 mg | ORAL_CAPSULE | Freq: Two times a day (BID) | ORAL | 0 refills | Status: DC

## 2018-10-19 NOTE — Telephone Encounter (Signed)
[place] Database Verified LR: 09-15-2018 Qty: 60 Pending appointment: 12-15-2018

## 2018-11-20 ENCOUNTER — Other Ambulatory Visit: Payer: Self-pay

## 2018-11-23 ENCOUNTER — Other Ambulatory Visit: Payer: Self-pay

## 2018-11-23 MED ORDER — AMPHETAMINE-DEXTROAMPHET ER 25 MG PO CP24: 25.0000 mg | ORAL_CAPSULE | Freq: Two times a day (BID) | ORAL | 0 refills | Status: DC

## 2018-11-23 NOTE — Telephone Encounter (Signed)
[place] Database Verified LR: 10-22-2018 Qty: 60 Pending appointment: 12-15-2018

## 2018-12-14 ENCOUNTER — Other Ambulatory Visit: Payer: Self-pay | Admitting: *Deleted

## 2018-12-14 MED ORDER — LAMOTRIGINE 100 MG PO TABS: 300.0000 mg | ORAL_TABLET | Freq: Two times a day (BID) | ORAL | 3 refills | Status: DC

## 2018-12-15 ENCOUNTER — Other Ambulatory Visit: Payer: Self-pay

## 2018-12-15 ENCOUNTER — Encounter: Payer: Self-pay | Admitting: Neurology

## 2018-12-15 ENCOUNTER — Ambulatory Visit (INDEPENDENT_AMBULATORY_CARE_PROVIDER_SITE_OTHER): Payer: Managed Care, Other (non HMO) | Admitting: Neurology

## 2018-12-15 VITALS — BP 110/60 | HR 82 | Temp 96.9°F | Ht 62.0 in | Wt 124.5 lb

## 2018-12-15 DIAGNOSIS — G40219 Localization-related (focal) (partial) symptomatic epilepsy and epileptic syndromes with complex partial seizures, intractable, without status epilepticus: Secondary | ICD-10-CM | POA: Diagnosis not present

## 2018-12-15 DIAGNOSIS — R4189 Other symptoms and signs involving cognitive functions and awareness: Secondary | ICD-10-CM | POA: Diagnosis not present

## 2018-12-15 DIAGNOSIS — R5383 Other fatigue: Secondary | ICD-10-CM

## 2018-12-15 DIAGNOSIS — R260 Ataxic gait: Secondary | ICD-10-CM

## 2018-12-15 DIAGNOSIS — G35 Multiple sclerosis: Secondary | ICD-10-CM | POA: Diagnosis not present

## 2018-12-15 NOTE — Progress Notes (Signed)
Paula Anderson   [facility]  PATIENT: Paula Anderson DOB: [birth year]  REFERRING DOCTOR OR PCP:  Delbert Harness    Fax [phone] ______________________________   HISTORICAL  CHIEF COMPLAINT:  Chief Complaint  Patient presents with   Follow-up    RM 13, alone. Last seen 08/05/2018   Multiple Sclerosis    On Tysabri. Last JCV ab drawn 08/05/18 negative, index: 0.14. Next infusion: 12/23/2018    HISTORY OF PRESENT ILLNESS:  Paula Anderson is a 43 y.o. woman with relapsing remitting multiple sclerosis and seizures.    Update 12/15/2018: Her MS has been stable on Tysabri.   She has no exacerbation.   Her JCV Ab is 0.14 negative tested in June 2020.     Gait is doing about the same.    She has stumbles but no falls.   Her right leg is a little weaker and clumsy compared to the left.   She has a foot drop if she goes a couple hundred feet at a a time.  She has a cold sensation in her right leg and hands but no actual numbness.       Vision is doing the same.   She had one UTI the past few months.     She did not have any more seizures.     She has some cognitive issues, with focus/attention, short term memory, processing..  She has never had neurocognitive testing.    Fatigue bothers her many days a week.    On those days she does less activity.  Sleep is poor some nights and temazepam helps.      Adderall helps the fatigue. .   She did not get disability.    Update 08/05/2018: She started to have vertigo 2 days ago and then felt weaker and more off balanced.  Her right side seems clumsier.  She had trouble holding a bowl of cereal.    Her last Tysabri was a month ago.    She switched to Tysabri form [id number] (Vumerity) December 2019 and is tolerating it well.   She feels strength is a little worse on the right.  She notes a little bit more numbness in the hands.  A few days ago, she had more severe urinary urgency and noted changes in his urine but no burning.  For a probable UTI she  was treated with Bactrim but has not noted a benefit.    She is still having frequency and urine is darker.      She is noting more fatigue the last week.  Due to the urinary frequency she is waking up some at night.  Mood is doing about the same.  She is feeling more tired the last week, despite Adderall.   She has no new seizures since a seizure in February 2020.   EEG was fine 3/5/20202.  She is on Keppra and lamotrigine and tolerates them well.   Update 06/11/2018 (virtual) She feels her MS is stable.   She is on Tysabri and tolerates it well.   She denies exacerbations.   Gait is doing about the same   Strength is about the same.   She has mild muscle spasticity helped by Flexeril and valium She has cold sensations in her hands but no other numbness.    Bladder function is ok.  She notes reduced visual acuity but this is not new.   She is always tired and feels she could sleep 14-15 hours some days.   She  is on Adderall which seemed to help her fatigue more initially.  It still helps some.   She notes reduced focus and attention and the Adderall is only helping this a little bit.       She has insomnia and takes Restoril only as couple times a week.      She denies any more seizure or spells.  EEG 04/23/2018 was normal.  She is on lamotrigine and Keppra and tolerates them well.    She has a migraine only about once a month for 1-2 days.   Rest usually helps.       Update 11/28/2017: She has been enrolled in the [id number] MS study (drug is very similar to Tecfidera).   Although she has tolerated medicine well, she has had some worsening symptoms over the last year with some progression.   She is interested in switching to a different disease modifying therapy.  We had a very long discussion about options.  As she has had some progression my recommendation would be to either start Mayzent, Tysabri or Ocrevus.  We went over the pros and cons of each of these medications.  Additionally literature was  provided.  She will need to return for an early termination visit and she will think about her options between now and then.  She was tested for the JCV antibody earlier this year and was negative.  She notes that vision is worse, left more than right.  This is more in the peripheral vcision and noted while driving.   Colors may be mildly different.  She also has noted some difficulty with swallowing.    She has had some choking on food of all consistency.   She is not choking on liquids.      She also notes her gait is more off balanced and she doesnt feel safe walking with her grandchildren.     She has dropped some items.     She has not had any seizures on Lamictal with Keppra.   Update 12/16/2016:    Since her last visit, she feels her balance is worse.   She does not use a cane and can walk a mile if she needed to.   She has had a couple falls and hurt her arm the one time.    She has more right hand clumsiness and writing is worse.  She denies any new numbness or dysesthesia.   The only medication she has been on is Copaxone.   we discussed that with 2 exacerbations in the Rebif 6 months that I feel she needs to switch to a different medication and we discussed options.    She also feels the vision is worse in her right eye.    Usually the left eye is worse than the right but the right eye worsened 2 weeks ago.   Her left eye is still worse but right eye now almost as bad.     She has had several UTI which improved with Bactrim and then came back and was treated with Keflex and she thinks it may have come back.  The recurrent UTI was resistant to Bactrim by report (10/14/16) and she was placed on Keflex so we'll try another antibiotic.   She had had some depression earlier this year and feels better on Prozac.   she feels mood is fine now.  No suicidal ideation.  Cognition is baseline.     She has not had any seizures on the new combination  of Lamictal plus Keppra (added)  She has vertigo and  has rarely taken Zofran but none in the past year.   Her last steroid was early August,  2018 after her last exacerbation.     Repeat Cervical spine MRI 02/03/2016 showed small non-nenhancing focus adjacent to C7T1 in the left posterolateral spinal cord.    Repeat Brain MRI 02/03/2016 is unchanged.     ________________________________________- From 09/16/2016:  MS:   She is on copaxone and she tolerates it well.    Last week, she had the onset of bilateral hand clumsiness.   No major change in strength or sensation.   She needed help to tie her shoes.   The gait seems slightly worse.    Gait/strength/sensation: Gait is mildly wide and slightly off balanced.    She feels people look at her when she walks and someone once thought she was drunk.   The right leg is dragging more and she catches the foot on the floor when she walks.     She stumbles daily and is falling every week or two.    She often needs to grab on to the wall or furniture for balance .    She has tingling in her legs but no more pronounced numbness.     Bladder/bowel:  She notes needing to urinate x 10 most days and x 1 most nights.   This is more than last year.      Vision: She has diplopia to left gaze (old) but now notes it when she looks right (new this week)   She feels vision is very reduced on her left.     She notes asymmetry with brightness and color.       Fatigue/sleep:  She has physical and mental fatigue.  Adderall XR 30 has helped.    She has insomnia and delayed phase sllep disorder (she goes to bed around 3 am and feels she can't go to bed earlier.)   Ambien caused a hangover effect the next day.     Mood/cognition:  Mood is doing well. She denies depression or anxiety. She does have some irritability and gets angry easily at times.     She has noted difficulty with short term memory, decreased ability to come up with the right word and decreased focus.     Seizure:  No seizures since her last visit. Earlier this  year, she had a seizure lasting 5 minutes. Her previous seizure was at least a year earlier she is on Keppra plus lamotrigine.      [place] had spikes but we do not have those results at this time.       MS History:   She presented with difficulties with speech, memory and gait balance in 2013. MRIs were consistent with MS.   In 2014, she had a severe episode of vertigo and was taken to the hospital. Repeat MRI imaging was performed and she was diagnosed officially with MS. She was started on Copaxone.   She received IV steroids couple times when she had severe fatigue.     She tolerates Copaxone well.    No difficulty with skin reactions.  I personally reviewed MRI images of the brain and spine.   In the posterior fossa there is a right middle cerebellar peduncle lesion, possible medulla focus and left midbrain focus and two small cerebellar hemisphere foci. She has thalamic foci.  There are multiple periventricular, deep  and juxtacortical white matter foci in the hemispheres. Some of these are oriented to the ventricles. Some are hypointense on T1-weighted images. None enhance.  C- Spine 10/19/2014 reported without plaques.  Repeat Brain MRI 02/03/2016 is unchanged.   Repeat Cervical spine MRI 02/03/2016 shows small no-nenhancing focus adjacent to C7T1 in the left posterolateral spinal cord  REVIEW OF SYSTEMS: Constitutional: No fevers, chills, sweats, or change in appetite.  She has fatigue and insomnia Eyes: as above Ear, nose and throat: No hearing loss, ear pain, nasal congestion, sore throat.   She has vertigo. Cardiovascular: No chest pain, palpitations Respiratory: No shortness of breath at rest or with exertion.   No wheezes GastrointestinaI: No nausea, vomiting, diarrhea, abdominal pain, fecal incontinence Genitourinal:   She has had multiple UTIs.  Musculoskeletal: No neck pain.  She  notesback pain Integumentary: No rash, pruritus, skin  lesions Neurological: as above Psychiatric: Notes mild depression at this time.  No anxiety Endocrine: No palpitations, diaphoresis, change in appetite, change in weigh or increased thirst Hematologic/Lymphatic: No anemia, purpura, petechiae.   She feels that she bruises easily. Allergic/Immunologic: No itchy/runny eyes, nasal congestion, recent allergic reactions, rashes  ALLERGIES: Allergies  Allergen Reactions   Amoxicillin Shortness Of Breath   Penicillins Shortness Of Breath    Skin turns red   Bupropion Other (See Comments)    Mood changes    HOME MEDICATIONS:  Current Outpatient Medications:    albuterol (PROVENTIL) (2.5 MG/3ML) 0.083% nebulizer solution, Take 2.5 mg by nebulization every 6 (six) hours as needed for wheezing or shortness of breath., Disp: , Rfl:    amphetamine-dextroamphetamine (ADDERALL XR) 25 MG 24 hr capsule, Take 1 capsule by mouth 2 (two) times daily., Disp: 60 capsule, Rfl: 0   cyclobenzaprine (FLEXERIL) 5 MG tablet, Take 1 tablet (5 mg total) by mouth 3 (three) times daily as needed for muscle spasms., Disp: 90 tablet, Rfl: 3   diazepam (VALIUM) 5 MG tablet, 1-2 tablets prior to flight, Disp: , Rfl:    Fluticasone Furoate-Vilanterol (BREO ELLIPTA) 100-25 MCG/INH AEPB, Inhale 1 puff into the lungs. , Disp: , Rfl:    lamoTRIgine (LAMICTAL) 100 MG tablet, Take 3 tablets (300 mg total) by mouth 2 (two) times daily., Disp: 540 tablet, Rfl: 3   levETIRAcetam (KEPPRA) 1000 MG tablet, Take 1 tablet (1,000 mg total) by mouth 2 (two) times daily., Disp: 60 tablet, Rfl: 11   meclizine (ANTIVERT) 25 MG tablet, Take up to 3 tablets per day as needed for vertigo., Disp: 30 tablet, Rfl: 0   naproxen (NAPROSYN) 500 MG tablet, Take 500 mg by mouth 2 (two) times daily with a meal., Disp: , Rfl:    Natalizumab (TYSABRI IV), Inject into the vein., Disp: , Rfl:    ondansetron (ZOFRAN) 8 MG tablet, Take 1 tablet (8 mg total) by mouth every 8 (eight) hours as  needed for nausea or vomiting., Disp: 30 tablet, Rfl: 1   predniSONE (DELTASONE) 50 MG tablet, 10 po x 1 day (500 g) then 8 po x 1d then 6 po x 1d, then 4 po x 1d, then 2 po x1d, then 1 po x 1d, then 1/2 po x 1d, Disp: 32 tablet, Rfl: 0   temazepam (RESTORIL) 15 MG capsule, Take 1 capsule (15 mg total) by mouth at bedtime as needed for sleep., Disp: 30 capsule, Rfl: 3   Vitamin D, Ergocalciferol, (DRISDOL) 50000 units CAPS capsule, Take 1 capsule (50,000 Units total) by mouth every 7 (seven) days., Disp: 12  capsule, Rfl: 1  PAST MEDICAL HISTORY: Past Medical History:  Diagnosis Date   Chronic headaches    Multiple sclerosis (HCC)    Seizures ([place])     PAST SURGICAL HISTORY: Past Surgical History:  Procedure Laterality Date   ABDOMINAL SURGERY     COSMETIC SURGERY      FAMILY HISTORY: Family History  Problem Relation Age of Onset   Cancer Father    Heart failure Paternal Grandmother     SOCIAL HISTORY:  Social History   Socioeconomic History   Marital status: Married    Spouse name: Iridian Reader   Number of children: 2   Years of education: 12   Highest education level: Not on file  Occupational History   Occupation: unemployeed  Scientist, product/process development strain: Not on file   Food insecurity    Worry: Not on file    Inability: Not on file   Transportation needs    Medical: Not on file    Non-medical: Not on file  Tobacco Use   Smoking status: Former Smoker   Smokeless tobacco: Never Used   Tobacco comment: quit 2010  Substance and Sexual Activity   Alcohol use: No    Alcohol/week: 0.0 standard drinks   Drug use: No   Sexual activity: Yes    Partners: Male  Lifestyle   Physical activity    Days per week: Not on file    Minutes per session: Not on file   Stress: Not on file  Relationships   Social connections    Talks on phone: Not on file    Gets together: Not on file    Attends religious service: Not on file    Active  member of club or organization: Not on file    Attends meetings of clubs or organizations: Not on file    Relationship status: Not on file   Intimate partner violence    Fear of current or ex partner: Not on file    Emotionally abused: Not on file    Physically abused: Not on file    Forced sexual activity: Not on file  Other Topics Concern   Not on file  Social History Narrative   Lives at home with husband and children   Drinks caffeine: 2 cups a day     PHYSICAL EXAM  Vitals:   12/15/18 1459  BP: 110/60  Pulse: 82  Temp: (!) 96.9 F (36.1 C)  SpO2: 98%  Weight: 124 lb 8 oz (56.5 kg)  Height: 5\' 2"  (1.575 m)    Body mass index is 22.77 kg/m.   General: The patient is well-developed and well-nourished and in no acute distress    Eyes:  Visual acuity is 20/100 OS and 20/70 OD.      Neurologic Exam  Mental status: The patient is alert and oriented x 3 at the time of the examination. The patient has apparent normal recent and remote memory, with reduced attention span and concentration ability.   Speech is normal.  Cranial nerves: She has nystagmus on lateral gaze...  Facial symmetry is present. Facial strength and sensation is normal. Trapezius strength is normal..   No obvious hearing deficits are noted.  Motor:  Muscle bulk is normal.  Muscle tone is mildly increased in the legs..  Strength is 5/5 except for plus/5 strength in the right foot  Sensory: She has reduced sensation to touch on the right side..     Coordination:   Finger-nose-finger and  heel-to-shin is reduced.  Rapid altering movements were performed a little better on the left than the right.  Gait and station: Station is normal.  Gait and tandem gait are wide.. She does not need support.   She has a mild right foot drop.  Romberg is negative.   Reflexes: Deep tendon reflexes are symmetric and increased (spread at knees) bilaterally.  DTRs are higher on the right leg and left leg.    ASSESSMENT  AND PLAN  1. Multiple sclerosis (HCC)   2. Partial symptomatic epilepsy with complex partial seizures, intractable, without status epilepticus (HCC)   3. Ataxic gait   4. Disturbed cognition   5. Other fatigue    1.   Continue Tysabri for relapsing remitting MS. we will recheck the JCV antibody in a couple months. 2.  Due to the combination of her physical and cognitive impairments as well as her fatigue, I believe she is disabled.  She would not be expected to improve to a point that she could return to work.  Consider neurocognitive testing to better objectively evaluate her cognitive issues. 3.   Try to stay active and exercise as tolerated. 4.   She will return to see me as scheduled or sooner if there are new or worsening neurologic symptoms.    Ellison Rieth A. Epimenio FootSater, MD, PhD, Larene BeachFAAN 12/15/2018, 6:11 PM Certified in Neurology, Clinical Neurophysiology, Sleep Medicine, Pain Medicine and Neuroimaging  [facility] [address], [address] [place], [place] [address] [phone]

## 2018-12-21 ENCOUNTER — Telehealth: Payer: Self-pay | Admitting: *Deleted

## 2018-12-21 NOTE — Telephone Encounter (Signed)
Faxed completed/signed Tysabri pt status report and reauth questionnaire to [facility] at [phone]. Received confirmation.   Received fax notification from touch prescribing program that pt re-authorized for Tysabri from 12/21/2018-07/22/2019. Pt enrollment #: [id number]. Account: [facility]. Site auth #: [id number].

## 2018-12-22 ENCOUNTER — Other Ambulatory Visit: Payer: Self-pay

## 2018-12-23 ENCOUNTER — Other Ambulatory Visit: Payer: Self-pay | Admitting: *Deleted

## 2018-12-23 MED ORDER — AMPHETAMINE-DEXTROAMPHET ER 25 MG PO CP24: 25.0000 mg | ORAL_CAPSULE | Freq: Two times a day (BID) | ORAL | 0 refills | Status: DC

## 2019-01-25 ENCOUNTER — Other Ambulatory Visit: Payer: Self-pay | Admitting: *Deleted

## 2019-01-25 MED ORDER — AMPHETAMINE-DEXTROAMPHET ER 25 MG PO CP24: 25.0000 mg | ORAL_CAPSULE | Freq: Two times a day (BID) | ORAL | 0 refills | Status: DC

## 2019-01-28 ENCOUNTER — Other Ambulatory Visit: Payer: Self-pay | Admitting: *Deleted

## 2019-01-28 DIAGNOSIS — R399 Unspecified symptoms and signs involving the genitourinary system: Secondary | ICD-10-CM

## 2019-02-02 ENCOUNTER — Other Ambulatory Visit: Payer: Self-pay | Admitting: Neurology

## 2019-02-02 DIAGNOSIS — N390 Urinary tract infection, site not specified: Secondary | ICD-10-CM

## 2019-02-04 ENCOUNTER — Other Ambulatory Visit: Payer: Self-pay | Admitting: *Deleted

## 2019-02-04 ENCOUNTER — Telehealth: Payer: Self-pay | Admitting: *Deleted

## 2019-02-04 DIAGNOSIS — N390 Urinary tract infection, site not specified: Secondary | ICD-10-CM

## 2019-02-04 NOTE — Telephone Encounter (Signed)
Late entry from earlier: I called pt while she was there and was able to release lab orders for the [facility] to view. Confirmed while I had pt on the phone. They also had account number. Nothing further needed.

## 2019-02-05 LAB — URINALYSIS, ROUTINE W REFLEX MICROSCOPIC
Bilirubin, UA: NEGATIVE
Glucose, UA: NEGATIVE
Ketones, UA: NEGATIVE
Leukocytes,UA: NEGATIVE
Nitrite, UA: NEGATIVE
Protein,UA: NEGATIVE
RBC, UA: NEGATIVE
Specific Gravity, UA: 1.024 (ref 1.005–1.030)
Urobilinogen, Ur: 1 mg/dL (ref 0.2–1.0)
pH, UA: 6 (ref 5.0–7.5)

## 2019-02-06 LAB — URINE CULTURE

## 2019-02-08 ENCOUNTER — Telehealth: Payer: Self-pay | Admitting: *Deleted

## 2019-02-08 NOTE — Telephone Encounter (Signed)
Monitor for now. -VRP 

## 2019-02-08 NOTE — Telephone Encounter (Signed)
-----   Message from Penni Bombard, MD sent at 02/08/2019 10:26 AM EST ----- No definite UTI. Possible contamination. -VRP

## 2019-02-10 MED ORDER — SULFAMETHOXAZOLE-TRIMETHOPRIM 800-160 MG PO TABS: 1.0000 | ORAL_TABLET | Freq: Two times a day (BID) | ORAL | 0 refills | Status: DC

## 2019-02-10 NOTE — Telephone Encounter (Signed)
Meds ordered this encounter  Medications  . sulfamethoxazole-trimethoprim (BACTRIM DS) 800-160 MG tablet    Sig: Take 1 tablet by mouth 2 (two) times daily.    Dispense:  14 tablet    Refill:  0    Penni Bombard, MD 11/24/1217, 75:88 AM Certified in Neurology, Neurophysiology and [facility] [address] [place], [place] [address] [phone]

## 2019-02-10 NOTE — Addendum Note (Signed)
Addended by: Andrey Spearman R on: 02/10/2019 10:26 AM   Modules accepted: Orders

## 2019-02-15 ENCOUNTER — Other Ambulatory Visit: Payer: Self-pay | Admitting: *Deleted

## 2019-02-15 DIAGNOSIS — R21 Rash and other nonspecific skin eruption: Secondary | ICD-10-CM

## 2019-02-23 ENCOUNTER — Other Ambulatory Visit: Payer: Self-pay

## 2019-02-23 MED ORDER — AMPHETAMINE-DEXTROAMPHET ER 25 MG PO CP24: 25.0000 mg | ORAL_CAPSULE | Freq: Two times a day (BID) | ORAL | 0 refills | Status: DC

## 2019-03-26 IMAGING — NM NM MISC PROCEDURE
6 series · 36 of 36 positions shown · non-contrast
Comparison: none

[Series 1: wbr_s-proj_st stress-gsp · 6.40mm/px · 6 of 512 frames shown]
[frame 43/512]
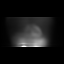
[frame 128/512]
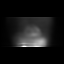
[frame 214/512]
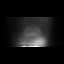
[frame 299/512]
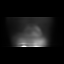
[frame 384/512]
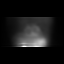
[frame 470/512]
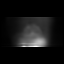

[Series 1: stress-sum-em · 6.40mm/px · 6 of 64 frames shown]
[frame 6/64]
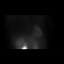
[frame 16/64]
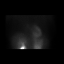
[frame 27/64]
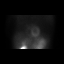
[frame 38/64]
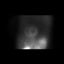
[frame 48/64]
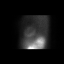
[frame 59/64]
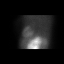

[Series 1: wbr_s-proj_st stress-sum-em · 6.40mm/px · 6 of 64 frames shown]
[frame 6/64]
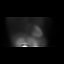
[frame 16/64]
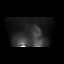
[frame 27/64]
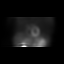
[frame 38/64]
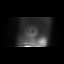
[frame 48/64]
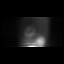
[frame 59/64]
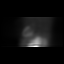

[Series 1: wbr_r-proj_st rest · 6.40mm/px · 6 of 64 frames shown]
[frame 6/64]
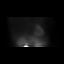
[frame 16/64]
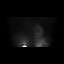
[frame 27/64]
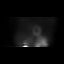
[frame 38/64]
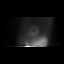
[frame 48/64]
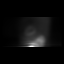
[frame 59/64]
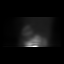

[Series 1: rest · 6.40mm/px · 6 of 64 frames shown]
[frame 6/64]
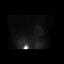
[frame 16/64]
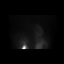
[frame 27/64]
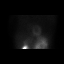
[frame 38/64]
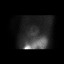
[frame 48/64]
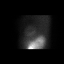
[frame 59/64]
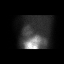

[Series 1: stress-gsp · 6.40mm/px · 6 of 512 frames shown]
[frame 43/512]
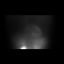
[frame 128/512]
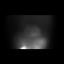
[frame 214/512]
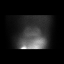
[frame 299/512]
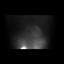
[frame 384/512]
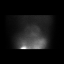
[frame 470/512]
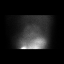

[36 of 36 positions shown; findings below may reference images not displayed]

Canned report from images found in remote index.

Refer to host system for actual result text.

## 2019-03-29 NOTE — Telephone Encounter (Signed)
Called, LVM for pt to call office. 

## 2019-03-30 ENCOUNTER — Other Ambulatory Visit: Payer: Self-pay

## 2019-03-30 ENCOUNTER — Telehealth: Payer: Self-pay | Admitting: *Deleted

## 2019-03-30 ENCOUNTER — Ambulatory Visit: Payer: Managed Care, Other (non HMO) | Admitting: Neurology

## 2019-03-30 ENCOUNTER — Encounter: Payer: Self-pay | Admitting: Neurology

## 2019-03-30 VITALS — BP 105/80 | HR 92 | Temp 96.6°F | Ht 62.0 in | Wt 126.0 lb

## 2019-03-30 DIAGNOSIS — G35 Multiple sclerosis: Secondary | ICD-10-CM | POA: Diagnosis not present

## 2019-03-30 DIAGNOSIS — Z79899 Other long term (current) drug therapy: Secondary | ICD-10-CM

## 2019-03-30 DIAGNOSIS — F419 Anxiety disorder, unspecified: Secondary | ICD-10-CM

## 2019-03-30 DIAGNOSIS — R260 Ataxic gait: Secondary | ICD-10-CM | POA: Diagnosis not present

## 2019-03-30 DIAGNOSIS — F988 Other specified behavioral and emotional disorders with onset usually occurring in childhood and adolescence: Secondary | ICD-10-CM

## 2019-03-30 DIAGNOSIS — F32A Depression, unspecified: Secondary | ICD-10-CM

## 2019-03-30 DIAGNOSIS — R350 Frequency of micturition: Secondary | ICD-10-CM

## 2019-03-30 DIAGNOSIS — R569 Unspecified convulsions: Secondary | ICD-10-CM | POA: Diagnosis not present

## 2019-03-30 DIAGNOSIS — F329 Major depressive disorder, single episode, unspecified: Secondary | ICD-10-CM

## 2019-03-30 MED ORDER — LEVETIRACETAM 1000 MG PO TABS: ORAL_TABLET | ORAL | 11 refills | Status: DC

## 2019-03-30 MED ORDER — LEVETIRACETAM 1000 MG PO TABS: 1000.0000 mg | ORAL_TABLET | Freq: Two times a day (BID) | ORAL | 11 refills | Status: DC

## 2019-03-30 MED ORDER — AMPHETAMINE-DEXTROAMPHET ER 25 MG PO CP24: 25.0000 mg | ORAL_CAPSULE | Freq: Two times a day (BID) | ORAL | 0 refills | Status: DC

## 2019-03-30 MED ORDER — DESVENLAFAXINE SUCCINATE ER 50 MG PO TB24: 50.0000 mg | ORAL_TABLET | Freq: Every day | ORAL | 5 refills | Status: DC

## 2019-03-30 NOTE — Telephone Encounter (Signed)
Placed JCV lab in [facility] lock box for routine lab pick up. Results pending. 

## 2019-03-30 NOTE — Progress Notes (Addendum)
Marland Kitchen   [facility]  PATIENT: Paula Anderson DOB: [birth year]  REFERRING DOCTOR OR PCP:  Delbert Harness    Fax [phone] ______________________________   HISTORICAL  CHIEF COMPLAINT:  Chief Complaint  Patient presents with  . Follow-up    RM 13. Last seen 12/15/2018.   . Multiple Sclerosis    On Tysabri. Last JCV ab 08/05/2018 negative, 0.14. NEEDS LABS TODAY.Last infusion:     HISTORY OF PRESENT ILLNESS:  Paula Anderson is a 44 y.o. woman with relapsing remitting multiple sclerosis and seizures.    Update 03/30/2019: She is on Tysabri for her MS.   Although she has no exacerbations, travel to our office monthly is difficult.   She wished to discuss other options, specifically Ocrevus.  She feels she is doing about the same neurologically with continued difficulties with gait and balance.  She stumbles but has not had any recent falls.  She notes that the right leg is a little weaker and clumsier than the left leg.  This worsens if she goes longer distances.  She notes some paresthesias of the right leg that are stable.  She has some urinary urgency that is stable.  She is noting mood disturbance.   She is short tempered and irritable.    She also has apathy.   She has been diagnosed with major depression.  She has had crying without reason She also has anxiety.   She has been on antidepressants without much benefit.   Prozac had nit helped in the past.   She takes valium for anxiety.     Her attention deficit disorder is worse with more difficulties focusing.  Although she has no definite new seizures, she has a couple spells where she feels she has zoned out.  She continues on Keppra 1000 mg twice a day and lamotrigine 300 mg twice daily.  Adderall has helped her Attention deficit issues in the past   Update 12/15/2018: Her MS has been stable on Tysabri.   She has no exacerbation.   Her JCV Ab is 0.14 negative tested in June 2020.     Gait is doing about the same.     She has stumbles but no falls.   Her right leg is a little weaker and clumsy compared to the left.   She has a foot drop if she goes a couple hundred feet at a a time.  She has a cold sensation in her right leg and hands but no actual numbness.       Vision is doing the same.   She had one UTI the past few months.     She did not have any more seizures.     She has some cognitive issues, with focus/attention, short term memory, processing..  She has never had neurocognitive testing.    Fatigue bothers her many days a week.    On those days she does less activity.  Sleep is poor some nights and temazepam helps.      Adderall helps the fatigue. .   She did not get disability.    Update 08/05/2018: She started to have vertigo 2 days ago and then felt weaker and more off balanced.  Her right side seems clumsier.  She had trouble holding a bowl of cereal.    Her last Tysabri was a month ago.    She switched to Tysabri form [id number] ([employer]umer[employer]) December 2019 and is tolerating it well.   She feels strength is a little worse  on the right.  She notes a little bit more numbness in the hands.  A few days ago, she had more severe urinary urgency and noted changes in his urine but no burning.  For a probable UTI she was treated with Bactrim but has not noted a benefit.    She is still having frequency and urine is darker.      She is noting more fatigue the last week.  Due to the urinary frequency she is waking up some at night.  Mood is doing about the same.  She is feeling more tired the last week, despite Adderall.   She has no new seizures since a seizure in February 2020.   EEG was fine 3/5/20202.  She is on Keppra and lamotrigine and tolerates them well.   Update 06/11/2018 (virtual) She feels her MS is stable.   She is on Tysabri and tolerates it well.   She denies exacerbations.   Gait is doing about the same   Strength is about the same.   She has mild muscle spasticity helped by Flexeril and valium She  has cold sensations in her hands but no other numbness.    Bladder function is ok.  She notes reduced visual acuity but this is not new.   She is always tired and feels she could sleep 14-15 hours some days.   She is on Adderall which seemed to help her fatigue more initially.  It still helps some.   She notes reduced focus and attention and the Adderall is only helping this a little bit.       She has insomnia and takes Restoril only as couple times a week.      She denies any more seizure or spells.  EEG 04/23/2018 was normal.  She is on lamotrigine and Keppra and tolerates them well.    She has a migraine only about once a month for 1-2 days.   Rest usually helps.       Update 11/28/2017: She has been enrolled in the [id number] MS study (drug is very similar to Tecfidera).   Although she has tolerated medicine well, she has had some worsening symptoms over the last year with some progression.   She is interested in switching to a different disease modifying therapy.  We had a very long discussion about options.  As she has had some progression my recommendation would be to either start Mayzent, Tysabri or Ocrevus.  We went over the pros and cons of each of these medications.  Additionally literature was provided.  She will need to return for an early termination visit and she will think about her options between now and then.  She was tested for the JCV antibody earlier this year and was negative.  She notes that vision is worse, left more than right.  This is more in the peripheral vcision and noted while driving.   Colors may be mildly different.  She also has noted some difficulty with swallowing.    She has had some choking on food of all consistency.   She is not choking on liquids.      She also notes her gait is more off balanced and she doesnt feel safe walking with her grandchildren.     She has dropped some items.     She has not had any seizures on Lamictal with Keppra.   Update 12/16/2016:     Since her last visit, she feels her balance is worse.  She does not use a cane and can walk a mile if she needed to.   She has had a couple falls and hurt her arm the one time.    She has more right hand clumsiness and writing is worse.  She denies any new numbness or dysesthesia.   The only medication she has been on is Copaxone.   we discussed that with 2 exacerbations in the Rebif 6 months that I feel she needs to switch to a different medication and we discussed options.    She also feels the vision is worse in her right eye.    Usually the left eye is worse than the right but the right eye worsened 2 weeks ago.   Her left eye is still worse but right eye now almost as bad.     She has had several UTI which improved with Bactrim and then came back and was treated with Keflex and she thinks it may have come back.  The recurrent UTI was resistant to Bactrim by report (10/14/16) and she was placed on Keflex so we'll try another antibiotic.   She had had some depression earlier this year and feels better on Prozac.   she feels mood is fine now.  No suicidal ideation.  Cognition is baseline.     She has not had any seizures on the new combination of Lamictal plus Keppra (added)  She has vertigo and has rarely taken Zofran but none in the past year.   Her last steroid was early August,  2018 after her last exacerbation.     Repeat Cervical spine MRI 02/03/2016 showed small non-nenhancing focus adjacent to C7T1 in the left posterolateral spinal cord.    Repeat Brain MRI 02/03/2016 is unchanged.     ________________________________________- From 09/16/2016:  MS:   She is on copaxone and she tolerates it well.    Last week, she had the onset of bilateral hand clumsiness.   No major change in strength or sensation.   She needed help to tie her shoes.   The gait seems slightly worse.    Gait/strength/sensation: Gait is mildly wide and slightly off balanced.    She feels people look at her when she walks and  someone once thought she was drunk.   The right leg is dragging more and she catches the foot on the floor when she walks.     She stumbles daily and is falling every week or two.    She often needs to grab on to the wall or furniture for balance .    She has tingling in her legs but no more pronounced numbness.     Bladder/bowel:  She notes needing to urinate x 10 most days and x 1 most nights.   This is more than last year.      Vision: She has diplopia to left gaze (old) but now notes it when she looks right (new this week)   She feels vision is very reduced on her left.     She notes asymmetry with brightness and color.       Fatigue/sleep:  She has physical and mental fatigue.  Adderall XR 30 has helped.    She has insomnia and delayed phase sllep disorder (she goes to bed around 3 am and feels she can't go to bed earlier.)   Ambien caused a hangover effect the next day.     Mood/cognition:  Mood is doing well. She denies depression or anxiety.  She does have some irritability and gets angry easily at times.     She has noted difficulty with short term memory, decreased ability to come up with the right word and decreased focus.     Seizure:  No seizures since her last visit. Earlier this year, she had a seizure lasting 5 minutes. Her previous seizure was at least a year earlier she is on Keppra plus lamotrigine.      She had an EEG in [place] that reportedly had spikes but we do not have those results at this time.       MS History:   She presented with difficulties with speech, memory and gait balance in 2013. MRIs were consistent with MS.   In 2014, she had a severe episode of vertigo and was taken to the hospital. Repeat MRI imaging was performed and she was diagnosed officially with MS. She was started on Copaxone.   She received IV steroids couple times when she had severe fatigue.     She tolerates Copaxone well.    No difficulty with skin reactions.  I personally reviewed MRI images of the  brain and spine.   In the posterior fossa there is a right middle cerebellar peduncle lesion, possible medulla focus and left midbrain focus and two small cerebellar hemisphere foci. She has thalamic foci.  There are multiple periventricular, deep and juxtacortical white matter foci in the hemispheres. Some of these are oriented to the ventricles. Some are hypointense on T1-weighted images. None enhance.  C- Spine 10/19/2014 reported without plaques.  Repeat Brain MRI 02/03/2016 is unchanged.   Repeat Cervical spine MRI 02/03/2016 shows small no-nenhancing focus adjacent to C7T1 in the left posterolateral spinal cord  REVIEW OF SYSTEMS: Constitutional: No fevers, chills, sweats, or change in appetite.  She has fatigue and insomnia Eyes: as above Ear, nose and throat: No hearing loss, ear pain, nasal congestion, sore throat.   She has vertigo. Cardiovascular: No chest pain, palpitations Respiratory: No shortness of breath at rest or with exertion.   No wheezes GastrointestinaI: No nausea, vomiting, diarrhea, abdominal pain, fecal incontinence Genitourinal:   She has had multiple UTIs.  Musculoskeletal: No neck pain.  She  notesback pain Integumentary: No rash, pruritus, skin lesions Neurological: as above Psychiatric: Notes mild depression at this time.  No anxiety Endocrine: No palpitations, diaphoresis, change in appetite, change in weigh or increased thirst Hematologic/Lymphatic: No anemia, purpura, petechiae.   She feels that she bruises easily. Allergic/Immunologic: No itchy/runny eyes, nasal congestion, recent allergic reactions, rashes  ALLERGIES: Allergies  Allergen Reactions  . Amoxicillin Shortness Of Breath  . Penicillins Shortness Of Breath    Skin turns red  . Bupropion Other (See Comments)    Mood changes    HOME MEDICATIONS:  Current Outpatient Medications:  .  albuterol (PROVENTIL) (2.5 MG/3ML) 0.083% nebulizer solution, Take 2.5 mg by nebulization every 6 (six) hours  as needed for wheezing or shortness of breath., Disp: , Rfl:  .  amphetamine-dextroamphetamine (ADDERALL XR) 25 MG 24 hr capsule, Take 1 capsule by mouth 2 (two) times daily., Disp: 60 capsule, Rfl: 0 .  cyclobenzaprine (FLEXERIL) 5 MG tablet, Take 1 tablet (5 mg total) by mouth 3 (three) times daily as needed for muscle spasms., Disp: 90 tablet, Rfl: 3 .  Fluticasone Furoate-Vilanterol (BREO ELLIPTA) 100-25 MCG/INH AEPB, Inhale 1 puff into the lungs. , Disp: , Rfl:  .  lamoTRIgine (LAMICTAL) 100 MG tablet, Take 3 tablets (300 mg total) by mouth 2 (  two) times daily., Disp: 540 tablet, Rfl: 3 .  levETIRAcetam (KEPPRA) 1000 MG tablet, Take one po qAM and 1 1/2 po qHS, Disp: 75 tablet, Rfl: 11 .  meclizine (ANTIVERT) 25 MG tablet, Take up to 3 tablets per day as needed for vertigo., Disp: 30 tablet, Rfl: 0 .  naproxen (NAPROSYN) 500 MG tablet, Take 500 mg by mouth 2 (two) times daily with a meal., Disp: , Rfl:  .  Natalizumab (TYSABRI IV), Inject into the vein., Disp: , Rfl:  .  ondansetron (ZOFRAN) 8 MG tablet, Take 1 tablet (8 mg total) by mouth every 8 (eight) hours as needed for nausea or vomiting., Disp: 30 tablet, Rfl: 1 .  sulfamethoxazole-trimethoprim (BACTRIM DS) 800-160 MG tablet, Take 1 tablet by mouth 2 (two) times daily., Disp: 14 tablet, Rfl: 0 .  temazepam (RESTORIL) 15 MG capsule, Take 1 capsule (15 mg total) by mouth at bedtime as needed for sleep., Disp: 30 capsule, Rfl: 3 .  Vitamin D, Ergocalciferol, (DRISDOL) 50000 units CAPS capsule, Take 1 capsule (50,000 Units total) by mouth every 7 (seven) days., Disp: 12 capsule, Rfl: 1 .  desvenlafaxine (PRISTIQ) 50 MG 24 hr tablet, Take 1 tablet (50 mg total) by mouth daily., Disp: 30 tablet, Rfl: 5  PAST MEDICAL HISTORY: Past Medical History:  Diagnosis Date  . Chronic headaches   . Multiple sclerosis (HCC)   . Seizures (HCC)     PAST SURGICAL HISTORY: Past Surgical History:  Procedure Laterality Date  . ABDOMINAL SURGERY    .  COSMETIC SURGERY      FAMILY HISTORY: Family History  Problem Relation Age of Onset  . Cancer Father   . Heart failure Paternal Grandmother     SOCIAL HISTORY:  Social History   Socioeconomic History  . Marital status: Married    Spouse name: Oryan Winterton  . Number of children: 2  . Years of education: 39  . Highest education level: Not on file  Occupational History  . Occupation: unemployeed  Tobacco Use  . Smoking status: Former Games developer  . Smokeless tobacco: Never Used  . Tobacco comment: quit 2010  Substance and Sexual Activity  . Alcohol use: No    Alcohol/week: 0.0 standard drinks  . Drug use: No  . Sexual activity: Yes    Partners: Male  Other Topics Concern  . Not on file  Social History Narrative   Lives at home with husband and children   Drinks caffeine: 2 cups a day   Social Determinants of Health   Financial Resource Strain:   . Difficulty of Paying Living Expenses: Not on file  Food Insecurity:   . Worried About Programme researcher, broadcasting/film/video in the Last Year: Not on file  . Ran Out of Food in the Last Year: Not on file  Transportation Needs:   . Lack of Transportation (Medical): Not on file  . Lack of Transportation (Non-Medical): Not on file  Physical Activity:   . Days of Exercise per Week: Not on file  . Minutes of Exercise per Session: Not on file  Stress:   . Feeling of Stress : Not on file  Social Connections:   . Frequency of Communication with Friends and Family: Not on file  . Frequency of Social Gatherings with Friends and Family: Not on file  . Attends Religious Services: Not on file  . Active Member of Clubs or Organizations: Not on file  . Attends Banker Meetings: Not on file  . Marital Status:  Not on file  Intimate Partner Violence:   . Fear of Current or Ex-Partner: Not on file  . Emotionally Abused: Not on file  . Physically Abused: Not on file  . Sexually Abused: Not on file     PHYSICAL EXAM  Vitals:   03/30/19 1319   BP: 105/80  Pulse: 92  Temp: (!) 96.6 F (35.9 C)  SpO2: 97%  Weight: 126 lb (57.2 kg)  Height: 5\' 2"  (1.575 m)    Body mass index is 23.05 kg/m.   General: The patient is well-developed and well-nourished and in no acute distress         Neurologic Exam  Mental status: The patient is alert and oriented x 3 at the time of the examination. The patient has apparent normal recent and remote memory, with reduced attention span and concentration ability.   Speech is normal.  Cranial nerves: She has nystagmus on lateral gaze...  Facial symmetry is present. Facial strength and sensation is normal. Trapezius strength is normal..   No obvious hearing deficits are noted.  Motor:  Muscle bulk is normal.  Muscle tone is mildly increased in the legs.. Strength is 5/5 in the arms and left leg and 4+/5 in the right foot.  Sensory: She has reduced sensation to touch on the right side and reduced vibration sensation on the left left..     Coordination:   Finger-nose-finger and heel-to-shin is reduced, a little worse on the right..  Rapid altering movements were performed a little better on the left than the right.  Gait and station: Station is normal.  Gait and tandem gait are wide.. She does not need support.  Mild right foot drop/.  Romberg is negative.   Reflexes: Deep tendon reflexes are symmetric and increased (spread at knees) bilaterally.     ASSESSMENT AND PLAN  1. Multiple sclerosis (HCC)   2. High risk medication use   3. Ataxic gait   4. Seizure-like activity (HCC)   5. Anxiety and depression   6. Increased frequency of urination   7. Attention deficit disorder (ADD) in adult    1.   We discussed therapeutic options for her MS.  In general she has been stable on Tysabri but the monthly visits to the office are difficult.  We discussed that there are studies investigating an injectable Tysabri.  However, it is not approved that way currently.  Ocrevus would be another good  option with fewer infusions.  I did discuss with her that it is possible that COVID-19 infections could be more severe with Ocrevus and it could also reduce the efficacy of vaccination.  There is very little actual data, however.  I would recommend that she stay on Tysabri until after she gets vaccinated and then we can switch to Ocrevus.  We will check lab work for both of these drugs.. 2.  For her depression I have added Pristiq.  She will find out from her counselor about referral to a psychiatrist.   3.   Due to the combination of her physical and cognitive impairments as well as her fatigue, I believe she is disabled.  She would not be expected to improve to a point that she could return to work.   4.   Adderall XR for ADD. 5.   She will return to see me as scheduled or sooner if there are new or worsening neurologic symptoms.    Bertrum Helmstetter A. , MD, PhD, [facility] 04/05/2019, 6:57 PM Certified in Neurology,  Clinical Neurophysiology, Sleep Medicine, Pain Medicine and Neuroimaging  [facility] [address] [place], [place] [address] [phone]

## 2019-03-31 NOTE — Telephone Encounter (Signed)
Submitted PA via [employer] w/ express scripts on CMM. Key: [id number]. Waiting on a determination.

## 2019-04-01 DIAGNOSIS — F988 Other specified behavioral and emotional disorders with onset usually occurring in childhood and adolescence: Secondary | ICD-10-CM | POA: Insufficient documentation

## 2019-04-01 NOTE — Telephone Encounter (Signed)
Faxed completed/signed appeal letter to [phone]. Marked urgent. Received fax confirmation. Waiting on determination.

## 2019-04-02 LAB — CBC WITH DIFFERENTIAL/PLATELET
Basophils Absolute: 0.1 10*3/uL (ref 0.0–0.2)
Basos: 1 %
EOS (ABSOLUTE): 0.1 10*3/uL (ref 0.0–0.4)
Eos: 2 %
Hematocrit: 40.8 % (ref 34.0–46.6)
Hemoglobin: 14 g/dL (ref 11.1–15.9)
Immature Grans (Abs): 0 10*3/uL (ref 0.0–0.1)
Immature Granulocytes: 0 %
Lymphocytes Absolute: 2.3 10*3/uL (ref 0.7–3.1)
Lymphs: 37 %
MCH: 30.2 pg (ref 26.6–33.0)
MCHC: 34.3 g/dL (ref 31.5–35.7)
MCV: 88 fL (ref 79–97)
Monocytes Absolute: 0.5 10*3/uL (ref 0.1–0.9)
Monocytes: 8 %
Neutrophils Absolute: 3.2 10*3/uL (ref 1.4–7.0)
Neutrophils: 52 %
Platelets: 264 10*3/uL (ref 150–450)
RBC: 4.63 x10E6/uL (ref 3.77–5.28)
RDW: 13 % (ref 11.7–15.4)
WBC: 6.1 10*3/uL (ref 3.4–10.8)

## 2019-04-02 LAB — QUANTIFERON-TB GOLD PLUS
QuantiFERON Mitogen Value: >10 IU/mL
QuantiFERON Nil Value: 0.08 IU/mL
QuantiFERON TB1 Ag Value: 0.13 IU/mL
QuantiFERON TB2 Ag Value: 0.16 IU/mL
QuantiFERON-TB Gold Plus: NEGATIVE

## 2019-04-02 LAB — HEPATIC FUNCTION PANEL
ALT: 13 IU/L (ref 0–32)
AST: 14 IU/L (ref 0–40)
Albumin: 3.9 g/dL (ref 3.8–4.8)
Alkaline Phosphatase: 89 IU/L (ref 39–117)
Bilirubin Total: 0.3 mg/dL (ref 0.0–1.2)
Bilirubin, Direct: 0.09 mg/dL (ref 0.00–0.40)
Total Protein: 6.6 g/dL (ref 6.0–8.5)

## 2019-04-02 LAB — HEPATITIS B SURFACE ANTIBODY,QUALITATIVE: Hep B Surface Ab, Qual: NONREACTIVE

## 2019-04-02 LAB — HIV ANTIBODY (ROUTINE TESTING W REFLEX): HIV Screen 4th Generation wRfx: NONREACTIVE

## 2019-04-02 LAB — HEPATITIS B SURFACE ANTIGEN: Hepatitis B Surface Ag: NEGATIVE

## 2019-04-02 LAB — HEPATITIS B CORE ANTIBODY, TOTAL: Hep B Core Total Ab: NEGATIVE

## 2019-04-12 NOTE — Telephone Encounter (Signed)
JCV ab drawn on 03/30/19 positive, index: 2.26

## 2019-04-13 ENCOUNTER — Telehealth: Payer: Self-pay | Admitting: *Deleted

## 2019-04-13 NOTE — Telephone Encounter (Signed)
Gave completed/signed Ocrevus start form to [facility] to process. Also included their order form, last OV notes, MRI, labs. Start form also faxed to [employer] at [phone]. Received fax confirmation.

## 2019-04-20 NOTE — Telephone Encounter (Signed)
Rep with [employer] called stating she received notice that  The patient infusion if on hold and is wanting to confirm if it is  Cb# [id number]

## 2019-04-20 NOTE — Telephone Encounter (Signed)
Called [employer] back. Advised Tysabri discontinued and therapy being switched to Ocrevus. Pt declined any further Tysabri infusions. They updated records, nothing further needed.

## 2019-05-07 ENCOUNTER — Other Ambulatory Visit: Payer: Self-pay

## 2019-05-07 MED ORDER — AMPHETAMINE-DEXTROAMPHET ER 25 MG PO CP24: 25.0000 mg | ORAL_CAPSULE | Freq: Two times a day (BID) | ORAL | 0 refills | Status: DC

## 2019-05-10 ENCOUNTER — Other Ambulatory Visit: Payer: Self-pay | Admitting: *Deleted

## 2019-05-10 MED ORDER — DESVENLAFAXINE SUCCINATE ER 50 MG PO TB24: 50.0000 mg | ORAL_TABLET | Freq: Every day | ORAL | 1 refills | Status: DC

## 2019-05-13 ENCOUNTER — Ambulatory Visit: Payer: Self-pay | Admitting: Neurology

## 2019-06-03 ENCOUNTER — Other Ambulatory Visit: Payer: Self-pay | Admitting: *Deleted

## 2019-06-03 MED ORDER — AMPHETAMINE-DEXTROAMPHET ER 25 MG PO CP24: 25.0000 mg | ORAL_CAPSULE | Freq: Two times a day (BID) | ORAL | 0 refills | Status: DC

## 2019-06-09 ENCOUNTER — Ambulatory Visit: Payer: Self-pay | Admitting: Neurology

## 2019-06-16 ENCOUNTER — Ambulatory Visit: Payer: Managed Care, Other (non HMO) | Admitting: Neurology

## 2019-06-30 ENCOUNTER — Telehealth: Payer: Self-pay | Admitting: *Deleted

## 2019-06-30 NOTE — Telephone Encounter (Signed)
Pt cx infusion appt today d/t no access to gas. R/s for 07/07/19. 1st Ocrevus infusion was 05/26/19 (300mg ). Dr. aware and ok with this. Informed infusion suite.

## 2019-07-11 ENCOUNTER — Other Ambulatory Visit: Payer: Self-pay

## 2019-07-12 MED ORDER — AMPHETAMINE-DEXTROAMPHET ER 25 MG PO CP24: 25.0000 mg | ORAL_CAPSULE | Freq: Two times a day (BID) | ORAL | 0 refills | Status: DC

## 2019-08-07 ENCOUNTER — Other Ambulatory Visit: Payer: Self-pay

## 2019-08-09 MED ORDER — AMPHETAMINE-DEXTROAMPHET ER 25 MG PO CP24: 25.0000 mg | ORAL_CAPSULE | Freq: Two times a day (BID) | ORAL | 0 refills | Status: DC

## 2019-08-17 ENCOUNTER — Other Ambulatory Visit: Payer: Self-pay | Admitting: Neurology

## 2019-08-17 MED ORDER — LISDEXAMFETAMINE DIMESYLATE 60 MG PO CAPS
60.0000 mg | ORAL_CAPSULE | ORAL | 0 refills | Status: DC
Start: 2019-08-17 — End: 2019-08-26

## 2019-08-18 ENCOUNTER — Telehealth: Payer: Self-pay | Admitting: *Deleted

## 2019-08-18 NOTE — Telephone Encounter (Signed)
Pa Vyvanse submitted on CMM.  Key: [id number] - PA Case ID: [id number]. Waiting on determination from [place].

## 2019-08-24 NOTE — Telephone Encounter (Signed)
[employer] and spoke with [place]. She informed me that pt does not have prescription benefits via them. She has express scripts. We can call them at [phone]. I called this number and spoke with [facility]. He states I need to call PA department at  [phone] to complete PA for Vyvanse 60mg . I called this number. Unable to locate pt via automated system.  I submitted PA on CMM w/ [employer] scripts. Key: [id number]. Received the following response:  "An active PA is already on file with expiration date of 08/17/2020. Please wait to resubmit request within 60 days of that expiration date to obtain a PA renewal."

## 2019-08-25 NOTE — Telephone Encounter (Signed)
CaseId:[id number];Status:Approved;Review Type:Prior Auth;Coverage Start Date:07/19/2019;Coverage End Date:08/17/2020;

## 2019-08-26 ENCOUNTER — Telehealth: Payer: Self-pay | Admitting: *Deleted

## 2019-08-26 ENCOUNTER — Other Ambulatory Visit: Payer: Self-pay | Admitting: *Deleted

## 2019-08-26 MED ORDER — AMPHETAMINE-DEXTROAMPHETAMINE 30 MG PO TABS: 30.0000 mg | ORAL_TABLET | Freq: Two times a day (BID) | ORAL | 0 refills | Status: DC

## 2019-08-26 NOTE — Telephone Encounter (Signed)
Submitted PA adderall 30mg  po BID on CMM. Key . Received immediate approval: : [id number];Review Type:Prior Auth;Coverage Start Date:08/02/2019;Coverage End Date:08/25/2020;

## 2019-08-29 ENCOUNTER — Encounter (INDEPENDENT_AMBULATORY_CARE_PROVIDER_SITE_OTHER): Payer: Self-pay

## 2019-08-30 ENCOUNTER — Other Ambulatory Visit: Payer: Self-pay | Admitting: Neurology

## 2019-08-30 MED ORDER — BACLOFEN 10 MG PO TABS: 10.0000 mg | ORAL_TABLET | Freq: Three times a day (TID) | ORAL | 5 refills | Status: DC

## 2019-08-30 NOTE — Progress Notes (Signed)
Baclofen for spasms

## 2019-09-20 ENCOUNTER — Other Ambulatory Visit: Payer: Self-pay | Admitting: Neurology

## 2019-09-20 MED ORDER — DONEPEZIL HCL 5 MG PO TABS: 5.0000 mg | ORAL_TABLET | Freq: Every day | ORAL | 3 refills | Status: DC

## 2019-09-29 ENCOUNTER — Ambulatory Visit (INDEPENDENT_AMBULATORY_CARE_PROVIDER_SITE_OTHER): Payer: Managed Care, Other (non HMO) | Admitting: Neurology

## 2019-09-29 ENCOUNTER — Encounter: Payer: Self-pay | Admitting: Neurology

## 2019-09-29 VITALS — BP 110/70 | HR 80 | Ht 62.0 in | Wt 124.5 lb

## 2019-09-29 DIAGNOSIS — G35 Multiple sclerosis: Secondary | ICD-10-CM

## 2019-09-29 DIAGNOSIS — Z79899 Other long term (current) drug therapy: Secondary | ICD-10-CM | POA: Insufficient documentation

## 2019-09-29 DIAGNOSIS — G40219 Localization-related (focal) (partial) symptomatic epilepsy and epileptic syndromes with complex partial seizures, intractable, without status epilepticus: Secondary | ICD-10-CM

## 2019-09-29 DIAGNOSIS — R260 Ataxic gait: Secondary | ICD-10-CM | POA: Diagnosis not present

## 2019-09-29 DIAGNOSIS — F329 Major depressive disorder, single episode, unspecified: Secondary | ICD-10-CM

## 2019-09-29 DIAGNOSIS — F419 Anxiety disorder, unspecified: Secondary | ICD-10-CM

## 2019-09-29 DIAGNOSIS — F988 Other specified behavioral and emotional disorders with onset usually occurring in childhood and adolescence: Secondary | ICD-10-CM

## 2019-09-29 MED ORDER — AMPHETAMINE-DEXTROAMPHETAMINE 30 MG PO TABS: 30.0000 mg | ORAL_TABLET | Freq: Two times a day (BID) | ORAL | 0 refills | Status: DC

## 2019-09-29 MED ORDER — LEVETIRACETAM 1000 MG PO TABS: ORAL_TABLET | ORAL | 4 refills | Status: DC

## 2019-09-29 MED ORDER — DONEPEZIL HCL 10 MG PO TABS: 5.0000 mg | ORAL_TABLET | Freq: Every day | ORAL | 3 refills | Status: DC

## 2019-09-29 MED ORDER — LAMOTRIGINE 100 MG PO TABS: 300.0000 mg | ORAL_TABLET | Freq: Two times a day (BID) | ORAL | 3 refills | Status: DC

## 2019-09-29 NOTE — Progress Notes (Signed)
Marland Kitchen   [facility]  PATIENT: Paula Anderson DOB: March 29, 1975  REFERRING DOCTOR OR PCP:  Delbert Harness    Fax [phone] ______________________________   HISTORICAL  CHIEF COMPLAINT:  Chief Complaint  Patient presents with  . Follow-up    RM 13, alone. Last seen 03/30/2019.   . Multiple Sclerosis    On Ocrevus. Last: 07/07/2019. Next: 01/19/2020. Receives at [facility] with intrafusion.    HISTORY OF PRESENT ILLNESS:  Paula Anderson is a 44 y.o. woman with relapsing remitting multiple sclerosis and seizures.    Update 09/29/2019: She is on Ocrevus for her MS, switching from Tysabri due to fewer infusions.      She has no exacerbations   She has difficulties with gait and balance.  She stumbles but has not had any recent falls.   She notes that the right leg is a little weaker and clumsier than the left leg.  She does worse with heat and when tired.    She notes some paresthesias of the right leg that are stable.  She has some urinary urgency that is stable.  She has been diagnosed with major depression.  She has had crying without reason She also has anxiety.    She notes being short tempered and irritable.   Pristiq has not helped more than other medication.   Also on lamotrigine 600 mg/day.    She takes valium for anxiety.     Her attention deficit disorder is worse with more difficulties focusing.   Adderall helps butnot as much as it firs did.    We just added donepezil 5 mg and she tolerates it but has not note any benefit.    She has no new seizures.  She is on Keppra 1000 mg twice a day and lamotrigine 300 mg twice daily.     She has had her Covid vaccination but did these while on Ocrevus.       MS History:   She presented with difficulties with speech, memory and gait balance in 2013. MRIs were consistent with MS.   In 2014, she had a severe episode of vertigo and was taken to the hospital. Repeat MRI imaging was performed and she was diagnosed officially with MS.  She was started on Copaxone.   She received IV steroids couple times when she had severe fatigue.     She tolerates Copaxone well.    No difficulty with skin reactions.  I personally reviewed MRI images of the brain and spine.   In the posterior fossa there is a right middle cerebellar peduncle lesion, possible medulla focus and left midbrain focus and two small cerebellar hemisphere foci. She has thalamic foci.  There are multiple periventricular, deep and juxtacortical white matter foci in the hemispheres. Some of these are oriented to the ventricles. Some are hypointense on T1-weighted images. None enhance.  C- Spine 10/19/2014 reported without plaques.  Repeat Brain MRI 02/03/2016 is unchanged.   Repeat Cervical spine MRI 02/03/2016 shows small no-nenhancing focus adjacent to C7T1 in the left posterolateral spinal cord.   In 2018, she started Vumerity due to breakthrough activity on Copaxone as part of a drug study but felt she did worse so switched to [place] and 2019.  She switched to [facility] May 2021 because travel to the office was difficult on a monthly basis.  REVIEW OF SYSTEMS: Constitutional: No fevers, chills, sweats, or change in appetite.  She has fatigue and insomnia Eyes: as above Ear, nose and throat: No hearing  loss, ear pain, nasal congestion, sore throat.   She has vertigo. Cardiovascular: No chest pain, palpitations Respiratory: No shortness of breath at rest or with exertion.   No wheezes GastrointestinaI: No nausea, vomiting, diarrhea, abdominal pain, fecal incontinence Genitourinal:   She has had multiple UTIs.  Musculoskeletal: No neck pain.  She  notesback pain Integumentary: No rash, pruritus, skin lesions Neurological: as above Psychiatric: Notes mild depression at this time.  No anxiety Endocrine: No palpitations, diaphoresis, change in appetite, change in weigh or increased thirst Hematologic/Lymphatic: No anemia, purpura, petechiae.   She feels that she bruises  easily. Allergic/Immunologic: No itchy/runny eyes, nasal congestion, recent allergic reactions, rashes  ALLERGIES: Allergies  Allergen Reactions  . Amoxicillin Shortness Of Breath  . Penicillins Shortness Of Breath    Skin turns red  . Bupropion Other (See Comments)    Mood changes    HOME MEDICATIONS:  Current Outpatient Medications:  .  albuterol (PROVENTIL) (2.5 MG/3ML) 0.083% nebulizer solution, Take 2.5 mg by nebulization every 6 (six) hours as needed for wheezing or shortness of breath., Disp: , Rfl:  .  amphetamine-dextroamphetamine (ADDERALL) 30 MG tablet, Take 1 tablet by mouth 2 (two) times daily., Disp: 60 tablet, Rfl: 0 .  baclofen (LIORESAL) 10 MG tablet, Take 1 tablet (10 mg total) by mouth 3 (three) times daily., Disp: 90 each, Rfl: 5 .  cyclobenzaprine (FLEXERIL) 5 MG tablet, Take 1 tablet (5 mg total) by mouth 3 (three) times daily as needed for muscle spasms., Disp: 90 tablet, Rfl: 3 .  desvenlafaxine (PRISTIQ) 50 MG 24 hr tablet, Take 1 tablet (50 mg total) by mouth daily., Disp: 90 tablet, Rfl: 1 .  donepezil (ARICEPT) 10 MG tablet, Take 0.5 tablets (5 mg total) by mouth at bedtime., Disp: 90 tablet, Rfl: 3 .  Fluticasone Furoate-Vilanterol (BREO ELLIPTA) 100-25 MCG/INH AEPB, Inhale 1 puff into the lungs. , Disp: , Rfl:  .  lamoTRIgine (LAMICTAL) 100 MG tablet, Take 3 tablets (300 mg total) by mouth 2 (two) times daily., Disp: 540 tablet, Rfl: 3 .  levETIRAcetam (KEPPRA) 1000 MG tablet, Take one po qAM and 1 1/2 po qHS, Disp: 225 tablet, Rfl: 4 .  meclizine (ANTIVERT) 25 MG tablet, Take up to 3 tablets per day as needed for vertigo., Disp: 30 tablet, Rfl: 0 .  naproxen (NAPROSYN) 500 MG tablet, Take 500 mg by mouth 2 (two) times daily with a meal., Disp: , Rfl:  .  ocrelizumab (OCREVUS) 300 MG/10ML injection, Inject into the vein once., Disp: , Rfl:  .  ondansetron (ZOFRAN) 8 MG tablet, Take 1 tablet (8 mg total) by mouth every 8 (eight) hours as needed for nausea or  vomiting., Disp: 30 tablet, Rfl: 1 .  sulfamethoxazole-trimethoprim (BACTRIM DS) 800-160 MG tablet, Take 1 tablet by mouth 2 (two) times daily., Disp: 14 tablet, Rfl: 0 .  temazepam (RESTORIL) 15 MG capsule, Take 1 capsule (15 mg total) by mouth at bedtime as needed for sleep., Disp: 30 capsule, Rfl: 3 .  Vitamin D, Ergocalciferol, (DRISDOL) 50000 units CAPS capsule, Take 1 capsule (50,000 Units total) by mouth every 7 (seven) days., Disp: 12 capsule, Rfl: 1  PAST MEDICAL HISTORY: Past Medical History:  Diagnosis Date  . Chronic headaches   . Multiple sclerosis (HCC)   . Seizures (HCC)     PAST SURGICAL HISTORY: Past Surgical History:  Procedure Laterality Date  . ABDOMINAL SURGERY    . COSMETIC SURGERY      FAMILY HISTORY: Family History  Problem Relation Age of Onset  . Cancer Father   . Heart failure Paternal Grandmother     SOCIAL HISTORY:  Social History   Socioeconomic History  . Marital status: Married    Spouse name: Derenda Giddings  . Number of children: 2  . Years of education: 86  . Highest education level: Not on file  Occupational History  . Occupation: unemployeed  Tobacco Use  . Smoking status: Former Games developer  . Smokeless tobacco: Never Used  . Tobacco comment: quit 2010  Vaping Use  . Vaping Use: Never used  Substance and Sexual Activity  . Alcohol use: No    Alcohol/week: 0.0 standard drinks  . Drug use: No  . Sexual activity: Yes    Partners: Male  Other Topics Concern  . Not on file  Social History Narrative   Lives at home with husband and children   Drinks caffeine: 2 cups a day   Social Determinants of Health   Financial Resource Strain:   . Difficulty of Paying Living Expenses:   Food Insecurity:   . Worried About Programme researcher, broadcasting/film/video in the Last Year:   . Barista in the Last Year:   Transportation Needs:   . Freight forwarder (Medical):   Marland Kitchen Lack of Transportation (Non-Medical):   Physical Activity:   . Days of Exercise per  Week:   . Minutes of Exercise per Session:   Stress:   . Feeling of Stress :   Social Connections:   . Frequency of Communication with Friends and Family:   . Frequency of Social Gatherings with Friends and Family:   . Attends Religious Services:   . Active Member of Clubs or Organizations:   . Attends Banker Meetings:   Marland Kitchen Marital Status:   Intimate Partner Violence:   . Fear of Current or Ex-Partner:   . Emotionally Abused:   Marland Kitchen Physically Abused:   . Sexually Abused:      PHYSICAL EXAM  Vitals:   09/29/19 1507  BP: 110/70  Pulse: 80  Weight: 124 lb 8 oz (56.5 kg)  Height: 5\' 2"  (1.575 m)    Body mass index is 22.77 kg/m.   General: The patient is well-developed and well-nourished and in no acute distress         Neurologic Exam  Mental status: The patient is alert and oriented x 3 at the time of the examination. The patient has apparent normal recent and remote memory, with reduced attention span and concentration ability.   Speech is normal.  Cranial nerves: She has nystagmus on lateral gaze...  Facial symmetry is present. Facial strength and sensation is normal. Trapezius strength is normal..   No obvious hearing deficits are noted.  Motor:  Muscle bulk is normal.  Muscle tone is mildly increased in the legs.. Strength is 5/5 in the arms and left leg and 4+/5 in the right foot.  Sensory: She has reduced sensation to touch on the right side and reduced vibration sensation on the left left..     Coordination:   Finger-nose-finger and heel-to-shin is reduced,  worse on the right..  Rapid altering movements were performed a little better on the left than the right.  Gait and station: Station is normal.  Gait is mildly wide.  Tandem gait is very poor... She does not need support.  She has a mild right foot drop.  Romberg is negative.   Reflexes: Deep tendon reflexes are symmetric and increased (  spread at knees) bilaterally.     ASSESSMENT AND  PLAN  1. Multiple sclerosis (HCC)   2. Partial symptomatic epilepsy with complex partial seizures, intractable, without status epilepticus (HCC)   3. High risk medication use   4. Ataxic gait   5. Attention deficit disorder (ADD) in adult   6. Anxiety and depression      1.   Continue Ocrevus.  We discussed getting a third vaccination for COVID-19 if it becomes available as people on Ocrevus may have a reduced response to vaccination. 2.  Continue medications for depression, ADD.  I will increase the donepezil to 10 mg. 3.   Due to the combination of her physical and cognitive impairments as well as her fatigue, I believe she is disabled.  She would not be expected to improve to a point that she could return to work.   4.   She will return to see in 6 months or sooner if new or worsening neurologic symptoms.    Tashona Calk A. Epimenio Foot, MD, PhD, Larene Beach 09/29/2019, 4:10 PM Certified in Neurology, Clinical Neurophysiology, Sleep Medicine, Pain Medicine and Neuroimaging  [facility] [address], [address] [place], [place] [address] [phone]

## 2019-10-12 ENCOUNTER — Other Ambulatory Visit: Payer: Self-pay | Admitting: Neurology

## 2019-11-01 ENCOUNTER — Other Ambulatory Visit: Payer: Self-pay | Admitting: *Deleted

## 2019-11-01 MED ORDER — AMPHETAMINE-DEXTROAMPHETAMINE 30 MG PO TABS
30.0000 mg | ORAL_TABLET | Freq: Two times a day (BID) | ORAL | 0 refills | Status: DC
Start: 2019-11-01 — End: 2019-12-02

## 2019-12-02 ENCOUNTER — Other Ambulatory Visit: Payer: Self-pay | Admitting: *Deleted

## 2019-12-02 MED ORDER — AMPHETAMINE-DEXTROAMPHETAMINE 30 MG PO TABS
30.0000 mg | ORAL_TABLET | Freq: Two times a day (BID) | ORAL | 0 refills | Status: DC
Start: 2019-12-02 — End: 2020-01-04

## 2019-12-20 DIAGNOSIS — Z0289 Encounter for other administrative examinations: Secondary | ICD-10-CM

## 2020-01-04 ENCOUNTER — Other Ambulatory Visit: Payer: Self-pay | Admitting: *Deleted

## 2020-01-04 MED ORDER — AMPHETAMINE-DEXTROAMPHETAMINE 30 MG PO TABS
30.0000 mg | ORAL_TABLET | Freq: Two times a day (BID) | ORAL | 0 refills | Status: DC
Start: 2020-01-04 — End: 2020-02-07

## 2020-01-20 ENCOUNTER — Other Ambulatory Visit: Payer: Self-pay | Admitting: Neurology

## 2020-02-07 ENCOUNTER — Other Ambulatory Visit: Payer: Self-pay | Admitting: *Deleted

## 2020-02-07 MED ORDER — AMPHETAMINE-DEXTROAMPHETAMINE 30 MG PO TABS
30.0000 mg | ORAL_TABLET | Freq: Two times a day (BID) | ORAL | 0 refills | Status: DC
Start: 2020-02-07 — End: 2020-03-20

## 2020-03-20 ENCOUNTER — Other Ambulatory Visit: Payer: Self-pay | Admitting: *Deleted

## 2020-03-20 MED ORDER — AMPHETAMINE-DEXTROAMPHETAMINE 30 MG PO TABS
30.0000 mg | ORAL_TABLET | Freq: Two times a day (BID) | ORAL | 0 refills | Status: DC
Start: 2020-03-20 — End: 2020-05-15

## 2020-04-04 ENCOUNTER — Ambulatory Visit: Payer: Managed Care, Other (non HMO) | Admitting: Neurology

## 2020-04-04 ENCOUNTER — Encounter: Payer: Self-pay | Admitting: Neurology

## 2020-04-04 VITALS — BP 108/70 | HR 98 | Ht 62.0 in | Wt 132.3 lb

## 2020-04-04 DIAGNOSIS — F32A Depression, unspecified: Secondary | ICD-10-CM

## 2020-04-04 DIAGNOSIS — G40219 Localization-related (focal) (partial) symptomatic epilepsy and epileptic syndromes with complex partial seizures, intractable, without status epilepticus: Secondary | ICD-10-CM

## 2020-04-04 DIAGNOSIS — F988 Other specified behavioral and emotional disorders with onset usually occurring in childhood and adolescence: Secondary | ICD-10-CM

## 2020-04-04 DIAGNOSIS — Z79899 Other long term (current) drug therapy: Secondary | ICD-10-CM

## 2020-04-04 DIAGNOSIS — G35 Multiple sclerosis: Secondary | ICD-10-CM

## 2020-04-04 DIAGNOSIS — F419 Anxiety disorder, unspecified: Secondary | ICD-10-CM

## 2020-04-04 DIAGNOSIS — R260 Ataxic gait: Secondary | ICD-10-CM

## 2020-04-04 NOTE — Progress Notes (Addendum)
Marland Kitchen   [facility]  PATIENT: Paula Anderson DOB: 1975-10-11  REFERRING DOCTOR OR PCP:  Delbert Harness    Fax [phone] ______________________________   HISTORICAL  CHIEF COMPLAINT:  Chief Complaint  Patient presents with  . Follow-up    RM 13. With husband Last seen 09/29/2019. On Ocrevus for MS. Last infusion: 01/26/20, next: 08/10/19. Receives at [facility] w/ intrafusion. Reports increased trouble with gait and decrease in memory. She is off the Aricept at this time.     HISTORY OF PRESENT ILLNESS:  Paula Anderson is a 45 y.o. woman with relapsing remitting multiple sclerosis and seizures.    Update 04/04/2020: She swtiched from Tysabri to ocrevus due to the JCV Ab converting to positive.   She feels very unsteady and has stumbled multiple times.    She hits the walls and furniture.   Derek Jack, she hit the stove and the gas was running and did not smell it.  Lots of stumbles but no recent falls in probably a year.     She feels she is pushed some to the left while walking and is more likely to fall that way.    And fairly normal but notes she drops items out of her right hand a lot.    Strength is about the same.  She denies numbness.    Bladder function is about the same.      She has a lot of fatigue daily, especially in the afternoons, despite Adderall 30 mg po twice a day (6 am and 4 pm)    She goes to bed at 7 pm if she does not take the Adderall or 12-1 am if she does.    Donepezil had not helped the cognitive issues and she stopped.    She has less depression and no current crying spells.  She has had crying without reason She also has anxiety.    She notes being short tempered and irritable.   Pristiq has not helped more than other medication.   Also on lamotrigine 600 mg/day.    She takes valium for anxiety.      She has no new seizures.  She is on Keppra 1000 mg twice a day and lamotrigine 300 mg twice daily.     She has had her Covid vaccination but did these while  on Ocrevus.  She had covid-19 last month and did ok being sick x 2-3 days but not having SOB.  .     MS History:   She presented with difficulties with speech, memory and gait balance in 2013. MRIs were consistent with MS.   In 2014, she had a severe episode of vertigo and was taken to the hospital. Repeat MRI imaging was performed and she was diagnosed officially with MS. She was started on Copaxone.   She received IV steroids couple times when she had severe fatigue.     She tolerates Copaxone well.    No difficulty with skin reactions.  I personally reviewed MRI images of the brain and spine.   In the posterior fossa there is a right middle cerebellar peduncle lesion, possible medulla focus and left midbrain focus and two small cerebellar hemisphere foci. She has thalamic foci.  There are multiple periventricular, deep and juxtacortical white matter foci in the hemispheres. Some of these are oriented to the ventricles. Some are hypointense on T1-weighted images. None enhance.  C- Spine 10/19/2014 reported without plaques.  Repeat Brain MRI 02/03/2016 is unchanged.   Repeat Cervical  spine MRI 02/03/2016 shows small no-nenhancing focus adjacent to C7T1 in the left posterolateral spinal cord.   In 2018, she started Vumerity due to breakthrough activity on Copaxone as part of a drug study but felt she did worse so switched to [place] and 2019.  She switched to [facility] May 2021 because travel to the office was difficult on a monthly basis.  IMAGING MRI of the brain 09/24/2017 shows multiple T2/FLAIR hypertense foci in the cerebellum, brainstem and cerebral hemispheres in a pattern and configuration consistent with chronic demyelinating plaque associated with multiple sclerosis.  None of the foci appears to be acute.  When compared to the MRI dated 05/10/2017, there is no interval change.     There is a normal enhancement pattern and there are no acute findings  MRI of the brain 12/12/2017 was unchanged.  MRI of  the brain 08/18/2018 was unchanged.  REVIEW OF SYSTEMS: Constitutional: No fevers, chills, sweats, or change in appetite.  She has fatigue and insomnia Eyes: as above Ear, nose and throat: No hearing loss, ear pain, nasal congestion, sore throat.   She has vertigo. Cardiovascular: No chest pain, palpitations Respiratory: No shortness of breath at rest or with exertion.   No wheezes GastrointestinaI: No nausea, vomiting, diarrhea, abdominal pain, fecal incontinence Genitourinal:   She has had multiple UTIs.  Musculoskeletal: No neck pain.  She  notesback pain Integumentary: No rash, pruritus, skin lesions Neurological: as above Psychiatric: Notes mild depression at this time.  No anxiety Endocrine: No palpitations, diaphoresis, change in appetite, change in weigh or increased thirst Hematologic/Lymphatic: No anemia, purpura, petechiae.   She feels that she bruises easily. Allergic/Immunologic: No itchy/runny eyes, nasal congestion, recent allergic reactions, rashes  ALLERGIES: Allergies  Allergen Reactions  . Amoxicillin Shortness Of Breath  . Penicillins Shortness Of Breath    Skin turns red  . Bupropion Other (See Comments)    Mood changes    HOME MEDICATIONS:  Current Outpatient Medications:  .  albuterol (PROVENTIL) (2.5 MG/3ML) 0.083% nebulizer solution, Take 2.5 mg by nebulization every 6 (six) hours as needed for wheezing or shortness of breath., Disp: , Rfl:  .  amphetamine-dextroamphetamine (ADDERALL) 30 MG tablet, Take 1 tablet by mouth 2 (two) times daily., Disp: 60 tablet, Rfl: 0 .  baclofen (LIORESAL) 10 MG tablet, Take 1 tablet (10 mg total) by mouth 3 (three) times daily., Disp: 90 each, Rfl: 5 .  desvenlafaxine (PRISTIQ) 50 MG 24 hr tablet, Take 1 tablet (50 mg total) by mouth daily., Disp: 90 tablet, Rfl: 1 .  fluticasone furoate-vilanterol (BREO ELLIPTA) 100-25 MCG/INH AEPB, Inhale 1 puff into the lungs. , Disp: , Rfl:  .  lamoTRIgine (LAMICTAL) 100 MG tablet,  Take 3 tablets (300 mg total) by mouth 2 (two) times daily., Disp: 540 tablet, Rfl: 3 .  levETIRAcetam (KEPPRA) 1000 MG tablet, Take one po qAM and 1 1/2 po qHS, Disp: 225 tablet, Rfl: 4 .  meclizine (ANTIVERT) 25 MG tablet, Take up to 3 tablets per day as needed for vertigo., Disp: 30 tablet, Rfl: 0 .  naproxen (NAPROSYN) 500 MG tablet, Take 500 mg by mouth 2 (two) times daily with a meal., Disp: , Rfl:  .  ocrelizumab (OCREVUS) 300 MG/10ML injection, Inject into the vein once., Disp: , Rfl:  .  ondansetron (ZOFRAN) 8 MG tablet, TAKE ONE TABLET BY MOUTH EVERY 8 HOURS AS NEEDED FOR NAUSEA AND VOMITING, Disp: 30 tablet, Rfl: 1 .  temazepam (RESTORIL) 15 MG capsule, Take 1 capsule (  15 mg total) by mouth at bedtime as needed for sleep., Disp: 30 capsule, Rfl: 3  PAST MEDICAL HISTORY: Past Medical History:  Diagnosis Date  . Chronic headaches   . Multiple sclerosis (HCC)   . Seizures (HCC)     PAST SURGICAL HISTORY: Past Surgical History:  Procedure Laterality Date  . ABDOMINAL SURGERY    . COSMETIC SURGERY      FAMILY HISTORY: Family History  Problem Relation Age of Onset  . Cancer Father   . Heart failure Paternal Grandmother     SOCIAL HISTORY:  Social History   Socioeconomic History  . Marital status: Married    Spouse name: Meghanne Pletz  . Number of children: 2  . Years of education: 75  . Highest education level: Not on file  Occupational History  . Occupation: unemployeed  Tobacco Use  . Smoking status: Former Games developer  . Smokeless tobacco: Never Used  . Tobacco comment: quit 2010  Vaping Use  . Vaping Use: Never used  Substance and Sexual Activity  . Alcohol use: No    Alcohol/week: 0.0 standard drinks  . Drug use: No  . Sexual activity: Yes    Partners: Male  Other Topics Concern  . Not on file  Social History Narrative   Lives at home with husband and children   Drinks caffeine: 2 cups a day   Social Determinants of Health   Financial Resource Strain: Not  on file  Food Insecurity: Not on file  Transportation Needs: Not on file  Physical Activity: Not on file  Stress: Not on file  Social Connections: Not on file  Intimate Partner Violence: Not on file     PHYSICAL EXAM  Vitals:   04/04/20 1525  BP: 108/70  Pulse: 98  SpO2: 99%  Weight: 132 lb 5 oz (60 kg)  Height: 5\' 2"  (1.575 m)    Body mass index is 24.2 kg/m.   General: The patient is well-developed and well-nourished and in no acute distress         Neurologic Exam  Mental status: The patient is alert and oriented x 3 at the time of the examination. The patient has apparent normal recent and remote memory, with reduced attention span and concentration ability.   Speech is normal.  Cranial nerves: She has nystagmus on lateral gaze...  Facial symmetry is present. Facial strength and sensation is normal. Trapezius strength is normal..   No obvious hearing deficits are noted.  Motor:  Muscle bulk is normal.  Muscle tone is mildly increased in the legs.. Strength is 5/5 in the arms and left leg and 4+/5 in the right foot.  Sensory: She has reduced sensation to touch on the right side and reduced vibration sensation on the left left..     Coordination:   Finger-nose-finger and heel-to-shin is reduced,  worse on the right..  Rapid altering movements were performed a little better on the left than the right.  Gait and station: Station is normal.  Gait is mildly wide.  Tandem gait is very poor and severe left. She does not need support.  She has a mild right foot drop.  Romberg is negative.   Reflexes: Deep tendon reflexes are symmetric and increased (spread at knees) bilaterally.     ASSESSMENT AND PLAN  1. Multiple sclerosis (HCC)   2. High risk medication use   3. Ataxic gait   4. Partial symptomatic epilepsy with complex partial seizures, intractable, without status epilepticus (HCC)   5.  Attention deficit disorder (ADD) in adult   6. Anxiety and depression       1.   Continue Ocrevus.  Check lab work today.  Around the time of her next visit we also need to check week heat MRI of the brain and cervical spine to determine if there is any subclinical progression and consider a different disease modifying therapy if this is happening. 2.  Continue medications for depression, ADD.   3.   Due to the combination of her physical and cognitive impairments as well as her fatigue, I believe she is disabled.  She would not be expected to improve to a point that she could return to work.   4.   She will return to see in 6 months or sooner if new or worsening neurologic symptoms.    Karan Ramnauth A. Epimenio Foot, MD, PhD, [place] 04/04/2020, 5:30 PM Certified in Neurology, Clinical Neurophysiology, Sleep Medicine, Pain Medicine and Neuroimaging  [facility] [address] [place], [place] [address] [phone]

## 2020-04-05 LAB — CBC WITH DIFFERENTIAL/PLATELET
Basophils Absolute: 0.1 10*3/uL (ref 0.0–0.2)
Basos: 1 %
EOS (ABSOLUTE): 0.2 10*3/uL (ref 0.0–0.4)
Eos: 2 %
Hematocrit: 43.3 % (ref 34.0–46.6)
Hemoglobin: 14.5 g/dL (ref 11.1–15.9)
Immature Grans (Abs): 0 10*3/uL (ref 0.0–0.1)
Immature Granulocytes: 0 %
Lymphocytes Absolute: 1.8 10*3/uL (ref 0.7–3.1)
Lymphs: 22 %
MCH: 30.7 pg (ref 26.6–33.0)
MCHC: 33.5 g/dL (ref 31.5–35.7)
MCV: 92 fL (ref 79–97)
Monocytes Absolute: 0.9 10*3/uL (ref 0.1–0.9)
Monocytes: 10 %
Neutrophils Absolute: 5.3 10*3/uL (ref 1.4–7.0)
Neutrophils: 65 %
Platelets: 282 10*3/uL (ref 150–450)
RBC: 4.73 x10E6/uL (ref 3.77–5.28)
RDW: 12.5 % (ref 11.7–15.4)
WBC: 8.3 10*3/uL (ref 3.4–10.8)

## 2020-04-05 LAB — IGG, IGA, IGM
IgA/Immunoglobulin A, Serum: 186 mg/dL (ref 87–352)
IgG (Immunoglobin G), Serum: 765 mg/dL (ref 586–1602)
IgM (Immunoglobulin M), Srm: 113 mg/dL (ref 26–217)

## 2020-04-17 ENCOUNTER — Other Ambulatory Visit: Payer: Self-pay | Admitting: *Deleted

## 2020-04-17 DIAGNOSIS — G35 Multiple sclerosis: Secondary | ICD-10-CM

## 2020-04-18 ENCOUNTER — Telehealth: Payer: Self-pay | Admitting: Neurology

## 2020-04-18 NOTE — Telephone Encounter (Signed)
Rutherford Nail: [id number] (exp. 04/18/20 to 07/17/20)  LVM for pt call back to schedule 04/18/20

## 2020-04-19 NOTE — Telephone Encounter (Signed)
Patient returned call and she is scheduled at [facility] for 04/25/20.

## 2020-04-25 ENCOUNTER — Ambulatory Visit (INDEPENDENT_AMBULATORY_CARE_PROVIDER_SITE_OTHER): Payer: Managed Care, Other (non HMO)

## 2020-04-25 DIAGNOSIS — G35 Multiple sclerosis: Secondary | ICD-10-CM | POA: Diagnosis not present

## 2020-05-15 ENCOUNTER — Other Ambulatory Visit: Payer: Self-pay | Admitting: *Deleted

## 2020-05-15 MED ORDER — AMPHETAMINE-DEXTROAMPHETAMINE 30 MG PO TABS: 30.0000 mg | ORAL_TABLET | Freq: Two times a day (BID) | ORAL | 0 refills | Status: DC

## 2020-05-29 ENCOUNTER — Other Ambulatory Visit: Payer: Self-pay | Admitting: *Deleted

## 2020-05-29 MED ORDER — ARMODAFINIL 200 MG PO TABS: 1.0000 | ORAL_TABLET | ORAL | 5 refills | Status: DC

## 2020-06-02 ENCOUNTER — Other Ambulatory Visit: Payer: Self-pay | Admitting: Family Medicine

## 2020-06-02 DIAGNOSIS — R928 Other abnormal and inconclusive findings on diagnostic imaging of breast: Secondary | ICD-10-CM

## 2020-07-24 ENCOUNTER — Other Ambulatory Visit: Payer: Self-pay | Admitting: *Deleted

## 2020-07-24 MED ORDER — AMPHETAMINE-DEXTROAMPHETAMINE 30 MG PO TABS: 30.0000 mg | ORAL_TABLET | Freq: Two times a day (BID) | ORAL | 0 refills | Status: DC

## 2020-08-17 ENCOUNTER — Telehealth: Payer: Self-pay | Admitting: Neurology

## 2020-08-17 NOTE — Telephone Encounter (Signed)
Rhiannon from Ocrevus called in regards to pt requesting call back from nurse.

## 2020-08-17 NOTE — Telephone Encounter (Signed)
Took call from phone staff and spoke w/ Rhiannon. Advised we did receive fax requesting to transition pt to home infusion. Either [facility] or [facility]. Advised we will try and work on this for pt to see if insurance will cover. Her next infusion is due 08/31/20. She will update pt. Nothing further needed at this time.

## 2020-08-17 NOTE — Telephone Encounter (Signed)
Called back and spoke w/ Rhiannon. Call ended unexpectedly. I called back and LVM for her to call

## 2020-08-23 NOTE — Telephone Encounter (Signed)
Faxed urgent referral to [facility] for pt Ocrevus infusion at [phone]. Received fax confirmation. Phone: [phone]. Received fax confirmation. I spoke with them on the phone prior to sending referral and confirmed they can do Ocrevus and will do benefit investigation prior to getting pt set up.

## 2020-08-28 NOTE — Telephone Encounter (Signed)
Called [facility] and spoke w/ [facility] in pharmacy department. She transferred me to [facility] VM w/ intake department. I asked for call back to check on status of this patient's referral. Provided [facility] office phone#

## 2020-08-29 ENCOUNTER — Other Ambulatory Visit: Payer: Self-pay | Admitting: Neurology

## 2020-08-29 MED ORDER — AMPHETAMINE-DEXTROAMPHETAMINE 30 MG PO TABS: 30.0000 mg | ORAL_TABLET | Freq: Two times a day (BID) | ORAL | 0 refills | Status: DC

## 2020-08-29 NOTE — Telephone Encounter (Signed)
Tried calling [facility] at number below. Phone continued to ring. Tried alternative number: [phone]. LVM for someone to call back to give update on pt referral

## 2020-08-29 NOTE — Telephone Encounter (Signed)
Took call from phone staff and spoke w/ Paula Anderson w/ [facility]. He took referral info verbally. He will send email to account executive with referral info. Marked urgent, asked to follow up asap. They will follow up w/ our office to get referral info faxed to them. They will then do benefit investigation prior to getting patient scheduled.  He will let them know referral faxed to [phone] on 08/23/20 as well, fax confirmation was received.

## 2020-08-29 NOTE — Telephone Encounter (Signed)
LVM for [facility] to call back about urgent referral. Provided office #

## 2020-08-30 NOTE — Telephone Encounter (Signed)
Received the following update from [facility]/ Mao Page: "They are reaching out to her with a welcome call and price quote of $10.00. They were able to get her on Ocrevus assistance program. Will let you know when nursing gets scheduled. Thank you!"

## 2020-08-30 NOTE — Telephone Encounter (Signed)
Took call from phone staff, spoke w/ Blenda Nicely (account executive w/ [facility]). Phone# [phone]. She will email me update once she can look further into patient's referral. States Ocrevus just transitioned to another team.

## 2020-09-13 ENCOUNTER — Encounter (INDEPENDENT_AMBULATORY_CARE_PROVIDER_SITE_OTHER): Payer: Self-pay

## 2020-09-21 ENCOUNTER — Other Ambulatory Visit: Payer: Self-pay | Admitting: Neurology

## 2020-09-21 MED ORDER — SULFAMETHOXAZOLE-TRIMETHOPRIM 800-160 MG PO TABS: 1.0000 | ORAL_TABLET | Freq: Two times a day (BID) | ORAL | 0 refills | Status: DC

## 2020-09-21 NOTE — Progress Notes (Signed)
[facility]

## 2020-10-02 ENCOUNTER — Ambulatory Visit: Payer: Managed Care, Other (non HMO) | Admitting: Neurology

## 2020-10-03 ENCOUNTER — Other Ambulatory Visit: Payer: Self-pay | Admitting: Family Medicine

## 2020-10-03 ENCOUNTER — Ambulatory Visit: Payer: Managed Care, Other (non HMO) | Admitting: Neurology

## 2020-10-03 DIAGNOSIS — N63 Unspecified lump in unspecified breast: Secondary | ICD-10-CM

## 2020-10-10 ENCOUNTER — Telehealth: Payer: Self-pay | Admitting: Neurology

## 2020-10-10 ENCOUNTER — Other Ambulatory Visit: Payer: Self-pay | Admitting: *Deleted

## 2020-10-10 MED ORDER — ONDANSETRON HCL 8 MG PO TABS: ORAL_TABLET | ORAL | 0 refills | Status: DC

## 2020-10-10 NOTE — Telephone Encounter (Signed)
I spoke with her husband.  Earlier today, Paula Anderson took an old tramadol.  She had several seizures this morning but only 1 possible small seizure in the last hour.  She is feeling better.  She is already on fairly high dose of lamotrigine and levetiracetam but I asked him to have her take 1 additional levetiracetam pill today.  Last week, she thought she might have had a urinary tract infection and took some over-the-counter supplement.  Her husband does not think she is having a fever but I advised him to ask her if she is having any UTI type symptoms.  If so we can send in Bactrim or another medication.  She should not take anymore tramadol as it can lower the seizure threshold.

## 2020-10-12 ENCOUNTER — Other Ambulatory Visit: Payer: Self-pay | Admitting: *Deleted

## 2020-10-12 MED ORDER — AMPHETAMINE-DEXTROAMPHETAMINE 30 MG PO TABS: 30.0000 mg | ORAL_TABLET | Freq: Two times a day (BID) | ORAL | 0 refills | Status: DC

## 2020-10-17 ENCOUNTER — Other Ambulatory Visit: Payer: Self-pay

## 2020-10-17 MED ORDER — LEVETIRACETAM 1000 MG PO TABS: ORAL_TABLET | ORAL | 4 refills | Status: DC

## 2020-11-20 ENCOUNTER — Other Ambulatory Visit: Payer: Self-pay | Admitting: *Deleted

## 2020-11-20 MED ORDER — AMPHETAMINE-DEXTROAMPHETAMINE 30 MG PO TABS: 30.0000 mg | ORAL_TABLET | Freq: Two times a day (BID) | ORAL | 0 refills | Status: DC

## 2020-11-23 ENCOUNTER — Ambulatory Visit: Payer: Managed Care, Other (non HMO) | Admitting: Neurology

## 2020-11-30 ENCOUNTER — Ambulatory Visit: Payer: Managed Care, Other (non HMO) | Admitting: Neurology

## 2020-12-26 ENCOUNTER — Other Ambulatory Visit: Payer: Self-pay

## 2020-12-26 ENCOUNTER — Other Ambulatory Visit: Payer: Self-pay | Admitting: Neurology

## 2020-12-26 MED ORDER — AMPHETAMINE-DEXTROAMPHETAMINE 30 MG PO TABS: 30.0000 mg | ORAL_TABLET | Freq: Two times a day (BID) | ORAL | 0 refills | Status: DC

## 2020-12-27 ENCOUNTER — Other Ambulatory Visit: Payer: Self-pay | Admitting: Neurology

## 2021-01-22 ENCOUNTER — Encounter: Payer: Self-pay | Admitting: Neurology

## 2021-01-23 ENCOUNTER — Telehealth (INDEPENDENT_AMBULATORY_CARE_PROVIDER_SITE_OTHER): Payer: Managed Care, Other (non HMO) | Admitting: Neurology

## 2021-01-23 ENCOUNTER — Encounter: Payer: Self-pay | Admitting: Neurology

## 2021-01-23 DIAGNOSIS — R350 Frequency of micturition: Secondary | ICD-10-CM | POA: Diagnosis not present

## 2021-01-23 DIAGNOSIS — G40219 Localization-related (focal) (partial) symptomatic epilepsy and epileptic syndromes with complex partial seizures, intractable, without status epilepticus: Secondary | ICD-10-CM

## 2021-01-23 DIAGNOSIS — F988 Other specified behavioral and emotional disorders with onset usually occurring in childhood and adolescence: Secondary | ICD-10-CM

## 2021-01-23 DIAGNOSIS — Z79899 Other long term (current) drug therapy: Secondary | ICD-10-CM

## 2021-01-23 DIAGNOSIS — R260 Ataxic gait: Secondary | ICD-10-CM

## 2021-01-23 DIAGNOSIS — R569 Unspecified convulsions: Secondary | ICD-10-CM

## 2021-01-23 DIAGNOSIS — G35 Multiple sclerosis: Secondary | ICD-10-CM

## 2021-01-23 DIAGNOSIS — R5383 Other fatigue: Secondary | ICD-10-CM

## 2021-01-23 MED ORDER — ARMODAFINIL 200 MG PO TABS: 1.0000 | ORAL_TABLET | ORAL | 5 refills | Status: DC

## 2021-01-23 MED ORDER — LAMOTRIGINE 100 MG PO TABS
300.0000 mg | ORAL_TABLET | Freq: Two times a day (BID) | ORAL | 3 refills | Status: DC
Start: 2021-01-23 — End: 2021-07-02

## 2021-01-23 NOTE — Progress Notes (Addendum)
Marland Kitchen   [facility]  PATIENT: Paula Anderson DOB: 1975-11-09  REFERRING DOCTOR OR PCP:  Delbert Harness    Fax [phone] ______________________________   HISTORICAL  CHIEF COMPLAINT:  No chief complaint on file.   HISTORY OF PRESENT ILLNESS:  Paula Anderson is a 45 y.o. woman with relapsing remitting multiple sclerosis and seizures.    Virtual Visit via Video Note I connected with Sawyer Hargadon on 01/23/21 at  3:00 PM EST by a video enabled telemedicine application and verified that I am speaking with the correct person.  I discussed the limitations of evaluation and management by telemedicine and the availability of in person appointments. The patient expressed understanding and agreed to proceed.  Patient was at home and the provider was in the office.  Update 01/23/2021: She swtiched from [facility]ys[facility]ri to Ocrevus due to the JCV Ab converting to positive.  She should be getting her next infusion in January.   She feels very unsteady and had one fall without reason.    She went to [facility] 10/11/2020.  She had an MRI and an EEG.  The MRI was read as no change compared to June 2020.   The EEG was slightly abnormal with L>R temporal slowing 10/11/2020 am but normal later that day.    She had difficulty with gait that week after the event.     Currently, she is back to her 2022 baseline.    She hits the walls and furniture at times.     She feels she is pushed some to the left while walking and is more likely to fall that way.    She drops items out of her right hand a lot.   Handwriting is poor.    Strength is about the same.  She denies numbness.    Bladder function is about the same.      She continues to report fatigue daily, especially in the afternoons, despite Adderall 30 mg po twice a day (6 am and 4 pm)    She goes to bed at 7 pm if she does not take the Adderall or 12-1 am if she does.  She sleeps well every night  Donepezil had not helped the cognitive issues and  she stopped.    She has some depression but no recent crying spells.. she also has anxiety.    She notes being short tempered and irritable.   Pristiq has not helped more than other medication.   Also on lamotrigine 600 mg/day.    She takes valium for anxiety.      She has no definite new seizures (unclear what happened 09/2020).  She is on Keppra 1000 mg twice a day and lamotrigine 300 mg twice daily.      MS History:   She presented with difficulties with speech, memory and gait balance in 2013. MRIs were consistent with MS.   In 2014, she had a severe episode of vertigo and was taken to the hospital. Repeat MRI imaging was performed and she was diagnosed officially with MS. She was started on Copaxone.   She received IV steroids couple times when she had severe fatigue.     She tolerates Copaxone well.    No difficulty with skin reactions.  I personally reviewed MRI images of the brain and spine.   In the posterior fossa there is a right middle cerebellar peduncle lesion, possible medulla focus and left midbrain focus and two small cerebellar hemisphere foci. She has thalamic foci.  There are multiple periventricular, deep and juxtacortical white matter foci in the hemispheres. Some of these are oriented to the ventricles. Some are hypointense on T1-weighted images. None enhance.  C- Spine 10/19/2014 reported without plaques.  Repeat Brain MRI 02/03/2016 is unchanged.   Repeat Cervical spine MRI 02/03/2016 shows small no-nenhancing focus adjacent to C7T1 in the left posterolateral spinal cord.   In 2018, she started Vumerity due to breakthrough activity on Copaxone as part of a drug study but felt she did worse so switched to [place] and 2019.  She switched to [facility] May 2021 because travel to the office was difficult on a monthly basis.  IMAGING MRI of the brain 09/24/2017 shows multiple T2/FLAIR hypertense foci in the cerebellum, brainstem and cerebral hemispheres in a pattern and configuration consistent  with chronic demyelinating plaque associated with multiple sclerosis.  None of the foci appears to be acute.  When compared to the MRI dated 05/10/2017, there is no interval change.     There is a normal enhancement pattern and there are no acute findings  MRI of the brain 12/12/2017 was unchanged.  MRI of the brain 08/18/2018 was unchanged.  REVIEW OF SYSTEMS: Constitutional: No fevers, chills, sweats, or change in appetite.  She has fatigue and insomnia Eyes: as above Ear, nose and throat: No hearing loss, ear pain, nasal congestion, sore throat.   She has vertigo. Cardiovascular: No chest pain, palpitations Respiratory:  No shortness of breath at rest or with exertion.   No wheezes GastrointestinaI: No nausea, vomiting, diarrhea, abdominal pain, fecal incontinence Genitourinal:   She has had multiple UTIs.  Musculoskeletal:  No neck pain.  She  notesback pain Integumentary: No rash, pruritus, skin lesions Neurological: as above Psychiatric: Notes mild depression at this time.  No anxiety Endocrine: No palpitations, diaphoresis, change in appetite, change in weigh or increased thirst Hematologic/Lymphatic:  No anemia, purpura, petechiae.   She feels that she bruises easily. Allergic/Immunologic: No itchy/runny eyes, nasal congestion, recent allergic reactions, rashes  ALLERGIES: Allergies  Allergen Reactions   Amoxicillin Shortness Of Breath   Penicillins Shortness Of Breath    Skin turns red   Bupropion Other (See Comments)    Mood changes    HOME MEDICATIONS:  Current Outpatient Medications:    albuterol (PROVENTIL) (2.5 MG/3ML) 0.083% nebulizer solution, Take 2.5 mg by nebulization every 6 (six) hours as needed for wheezing or shortness of breath., Disp: , Rfl:    amphetamine-dextroamphetamine (ADDERALL) 30 MG tablet, Take 1 tablet by mouth 2 (two) times daily., Disp: 60 tablet, Rfl: 0   Armodafinil 200 MG TABS, Take 1 tablet by mouth every morning., Disp: 30 tablet, Rfl: 5    baclofen (LIORESAL) 10 MG tablet, Take 1 tablet (10 mg total) by mouth 3 (three) times daily., Disp: 90 each, Rfl: 5   desvenlafaxine (PRISTIQ) 50 MG 24 hr tablet, Take 1 tablet (50 mg total) by mouth daily., Disp: 90 tablet, Rfl: 1   fluticasone furoate-vilanterol (BREO ELLIPTA) 100-25 MCG/INH AEPB, Inhale 1 puff into the lungs. , Disp: , Rfl:    lamoTRIgine (LAMICTAL) 100 MG tablet, Take 3 tablets (300 mg total) by mouth 2 (two) times daily., Disp: 180 tablet, Rfl: 3   levETIRAcetam (KEPPRA) 1000 MG tablet, Take one po qAM and 1 1/2 po qHS, Disp: 225 tablet, Rfl: 4   meclizine (ANTIVERT) 25 MG tablet, Take up to 3 tablets per day as needed for vertigo., Disp: 30 tablet, Rfl: 0   naproxen (NAPROSYN) 500 MG tablet,  Take 500 mg by mouth 2 (two) times daily with a meal., Disp: , Rfl:    ocrelizumab (OCREVUS) 300 MG/10ML injection, Inject into the vein once., Disp: , Rfl:    ondansetron (ZOFRAN) 8 MG tablet, TAKE ONE TABLET BY MOUTH THREE TIMES DAILY AS NEEDED FOR NAUSEA AND VOMITING, Disp: 20 tablet, Rfl: 0   sulfamethoxazole-trimethoprim (BACTRIM DS) 800-160 MG tablet, Take 1 tablet by mouth 2 (two) times daily., Disp: 14 tablet, Rfl: 0   temazepam (RESTORIL) 15 MG capsule, Take 1 capsule (15 mg total) by mouth at bedtime as needed for sleep., Disp: 30 capsule, Rfl: 3  PAST MEDICAL HISTORY: Past Medical History:  Diagnosis Date   Chronic headaches    Multiple sclerosis (HCC)    Seizures (HCC)     PAST SURGICAL HISTORY: Past Surgical History:  Procedure Laterality Date   ABDOMINAL SURGERY     COSMETIC SURGERY      FAMILY HISTORY: Family History  Problem Relation Age of Onset   Cancer Father    Heart failure Paternal Grandmother     SOCIAL HISTORY:  Social History   Socioeconomic History   Marital status: Married    Spouse name: Zandyr Barnhill   Number of children: 2   Years of education: 12   Highest education level: Not on file  Occupational History   Occupation: unemployeed   Tobacco Use   Smoking status: Former   Smokeless tobacco: Never   Tobacco comments:    quit 2010  Vaping Use   Vaping Use: Never used  Substance and Sexual Activity   Alcohol use: No    Alcohol/week: 0.0 standard drinks   Drug use: No   Sexual activity: Yes    Partners: Male  Other Topics Concern   Not on file  Social History Narrative   Lives at home with husband and children   Drinks caffeine: 2 cups a day   Social Determinants of Health   Financial Resource Strain: Not on file  Food Insecurity: Not on file  Transportation Needs: Not on file  Physical Activity: Not on file  Stress: Not on file  Social Connections: Not on file  Intimate Partner Violence: Not on file     PHYSICAL EXAM  There were no vitals filed for this visit.   There is no height or weight on file to calculate BMI.   General: The patient is well-developed and well-nourished and in no acute distress         Neurologic Exam  Mental status: The patient is alert and oriented x 3 at the time of the examination. The patient has apparent normal recent and remote memory, with reduced attention span and concentration ability.   Speech is normal.  Cranial nerves: She has nystagmus on lateral gaze...  Facial symmetry is present. Facial strength and sensation is normal. Trapezius strength is normal..   No obvious hearing deficits are noted.  Motor:  Muscle bulk is normal.  Muscle tone is mildly increased in the legs.. Strength is 5/5 in the arms and left leg and 4+/5 in the right foot.  Sensory: She has reduced sensation to touch on the right side and reduced vibration sensation on the left left..     Coordination:   Finger-nose-finger and heel-to-shin is reduced,  worse on the right..  Rapid altering movements were performed a little better on the left than the right.  Gait and station: Station is normal.  Gait is mildly wide.  Tandem gait is very poor and  severe left. She does not need support.  She  has a mild right foot drop.  Romberg is negative.   Reflexes: Deep tendon reflexes are symmetric and increased (spread at knees) bilaterally.     ASSESSMENT AND PLAN  1. Multiple sclerosis (HCC)   2. Seizure-like activity (HCC)   3. Increased frequency of urination   4. Attention deficit disorder (ADD) in adult   5. Ataxic gait   6. Partial symptomatic epilepsy with complex partial seizures, intractable, without status epilepticus (HCC)   7. Other fatigue   8. High risk medication use       1.  Continue Ocrevus.  Next infusion will be January.  We will check lab work when she comes in the clinic. 2.  Continue medications for depression, ADD.   3.   Due to the combination of her physical and cognitive impairments as well as her fatigue, I believe she is disabled.  She would not be expected to improve to a point that she could return to work.   4.   Continue Adderall and Nuvigil for sleepiness and fatigue.   5.  She will return to see in 6 months or sooner if new or worsening neurologic symptoms.     Follow Up Instructions: I discussed the assessment and treatment plan with the patient. The patient was provided an opportunity to ask questions and all were answered. The patient agreed with the plan and demonstrated an understanding of the instructions.    The patient was advised to call back or seek an in-person evaluation if the symptoms worsen or if the condition fails to improve as anticipated.  I provided 28 minutes of non-face-to-face time during this encounter. Page Lancon A. Epimenio Foot, MD, PhD, [place] 01/23/2021, 9:15 PM Certified in Neurology, Clinical Neurophysiology, Sleep Medicine, Pain Medicine and Neuroimaging  [facility] [address], [address] [place], [place] [address] [phone]

## 2021-02-09 ENCOUNTER — Other Ambulatory Visit: Payer: Self-pay | Admitting: Neurology

## 2021-02-09 MED ORDER — AMPHETAMINE-DEXTROAMPHETAMINE 30 MG PO TABS: 30.0000 mg | ORAL_TABLET | Freq: Two times a day (BID) | ORAL | 0 refills | Status: DC

## 2021-02-13 ENCOUNTER — Encounter: Payer: Self-pay | Admitting: Neurology

## 2021-02-13 MED ORDER — AMPHETAMINE-DEXTROAMPHETAMINE 20 MG PO TABS: ORAL_TABLET | ORAL | 0 refills | Status: DC

## 2021-02-13 NOTE — Telephone Encounter (Signed)
Called and spoke w/ tech at pharmacy. Pt currently taking 30mg  BID. Confirmed on back order. Per Dr. , can change to 20mg  TID. They confirmed they have this in stock, need MD to e-scribe new rx for this. Aware I will send request to MD to e-scribe. She verbalized understanding.

## 2021-03-01 ENCOUNTER — Ambulatory Visit: Payer: Managed Care, Other (non HMO) | Admitting: Neurology

## 2021-03-26 ENCOUNTER — Encounter: Payer: Self-pay | Admitting: Neurology

## 2021-03-26 ENCOUNTER — Other Ambulatory Visit: Payer: Self-pay | Admitting: *Deleted

## 2021-03-26 MED ORDER — AMPHETAMINE-DEXTROAMPHETAMINE 20 MG PO TABS: ORAL_TABLET | ORAL | 0 refills | Status: DC

## 2021-05-07 ENCOUNTER — Other Ambulatory Visit: Payer: Self-pay | Admitting: Neurology

## 2021-05-18 ENCOUNTER — Other Ambulatory Visit: Payer: Self-pay | Admitting: Neurology

## 2021-05-21 MED ORDER — AMPHETAMINE-DEXTROAMPHETAMINE 20 MG PO TABS: ORAL_TABLET | ORAL | 0 refills | Status: DC

## 2021-05-21 NOTE — Telephone Encounter (Signed)
Last OV was on 01/23/21.  ?Next OV is scheduled for 07/30/21.  ?Last RX was written on 04/03/21 for 90 tabs.  ? ?[place] [facility] Database has been reviewed.  ?

## 2021-06-11 ENCOUNTER — Encounter: Payer: Self-pay | Admitting: Neurology

## 2021-06-14 ENCOUNTER — Other Ambulatory Visit: Payer: Self-pay | Admitting: Neurology

## 2021-06-26 ENCOUNTER — Telehealth: Payer: Self-pay | Admitting: Neurology

## 2021-06-26 MED ORDER — AMPHETAMINE-DEXTROAMPHETAMINE 20 MG PO TABS: ORAL_TABLET | ORAL | 0 refills | Status: DC

## 2021-06-26 NOTE — Telephone Encounter (Signed)
Called and spoke with pt. She would like to continue taking adderall 20mg , 2 in the am and 1 after lunch. She needs refill. Aware Dr. out but I will send to [facility] to refill for her. She verbalized understanding. She has one pill left.  ? ?Checked drug registry. Last refilled 05/22/21 #90. Last seen 01/23/21 and next f/u 07/30/21.  ?

## 2021-06-26 NOTE — Telephone Encounter (Signed)
Adderall refilled on behalf of Dr. Epimenio Foot.  ?

## 2021-06-26 NOTE — Telephone Encounter (Signed)
Pt wants to know if its ok to take 3 a day of amphetamine-dextroamphetamine (ADDERALL) 20 MG tablet, please call ?

## 2021-06-26 NOTE — Addendum Note (Signed)
Addended by: Huston Foley on: 06/26/2021 01:38 PM ? ? Modules accepted: Orders ? ?

## 2021-06-26 NOTE — Addendum Note (Signed)
Addended by: Arther Abbott on: 06/26/2021 01:32 PM ? ? Modules accepted: Orders ? ?

## 2021-07-02 ENCOUNTER — Other Ambulatory Visit: Payer: Self-pay

## 2021-07-02 MED ORDER — LAMOTRIGINE 100 MG PO TABS: 300.0000 mg | ORAL_TABLET | Freq: Two times a day (BID) | ORAL | 5 refills | Status: DC

## 2021-07-30 ENCOUNTER — Encounter: Payer: Self-pay | Admitting: Neurology

## 2021-07-30 ENCOUNTER — Ambulatory Visit (INDEPENDENT_AMBULATORY_CARE_PROVIDER_SITE_OTHER): Payer: 59 | Admitting: Neurology

## 2021-07-30 VITALS — BP 118/74 | HR 94 | Ht 62.0 in | Wt 132.0 lb

## 2021-07-30 DIAGNOSIS — R260 Ataxic gait: Secondary | ICD-10-CM

## 2021-07-30 DIAGNOSIS — Z79899 Other long term (current) drug therapy: Secondary | ICD-10-CM

## 2021-07-30 DIAGNOSIS — F988 Other specified behavioral and emotional disorders with onset usually occurring in childhood and adolescence: Secondary | ICD-10-CM

## 2021-07-30 DIAGNOSIS — R5383 Other fatigue: Secondary | ICD-10-CM

## 2021-07-30 DIAGNOSIS — R350 Frequency of micturition: Secondary | ICD-10-CM

## 2021-07-30 DIAGNOSIS — G40219 Localization-related (focal) (partial) symptomatic epilepsy and epileptic syndromes with complex partial seizures, intractable, without status epilepticus: Secondary | ICD-10-CM

## 2021-07-30 DIAGNOSIS — F419 Anxiety disorder, unspecified: Secondary | ICD-10-CM

## 2021-07-30 DIAGNOSIS — F32A Depression, unspecified: Secondary | ICD-10-CM

## 2021-07-30 DIAGNOSIS — G35 Multiple sclerosis: Secondary | ICD-10-CM | POA: Diagnosis not present

## 2021-07-30 MED ORDER — AMPHETAMINE-DEXTROAMPHETAMINE 30 MG PO TABS: 30.0000 mg | ORAL_TABLET | Freq: Two times a day (BID) | ORAL | 0 refills | Status: DC

## 2021-07-30 NOTE — Progress Notes (Signed)
Marland Kitchen   [facility]  PATIENT: Paula Anderson DOB: 05-Nov-1975  REFERRING DOCTOR OR PCP:  Delbert Harness    Fax [phone] ______________________________   HISTORICAL  CHIEF COMPLAINT:  Chief Complaint  Patient presents with   Follow-up    Pt alone, rm 1. MS follow up. Patient is on Ocrevus. Last infusion date: 03/22/2021/Next infusion date: 09/19/2021. Needs blood work. Overall stable and states no issues or concerns.  Last sz noted was aug 2022 when she went to Hospital. She has stopped keppra because she feels it is causing her to feel sick     HISTORY OF PRESENT ILLNESS:  Paula Anderson is a 46 y.o. woman with relapsing remitting multiple sclerosis and seizures.    Update 6/12/203: She swtiched from Tysabri to ocrevus due to the JCV Ab converting to positive.  Her next infusion will be in August.   She has tolerated the Ocrevus well.  Gait is unsteady and has stumbled multiple times with one fall.       She hits the walls and furniture.   Derek Jack, she hit the stove and the gas was running and did not smell it.  Lots of stumbles but no recent falls in probably a year.     She feels she is pushed some to the left while walking and is more likely to fall that way.    And fairly normal but notes she drops items out of her right hand a lot.    Strength is about the same.  She denies numbness.    Bladder function is about the same.      She has a lot of fatigue daily, especially in the afternoons, despite Adderall 30 mg po twice a day (6 am and 4 pm)    She goes to bed at 7 pm if she does not take the Adderall or 12-1 am if she does.    For a while, she had difficulty getting Adderall.  She needs a refill.  Donepezil had not helped the cognitive issues and she stopped.    She notes some depression but this is better than a couple years ago..  She has anxiety.   Pristiq has not helped more than other medication.   Also on lamotrigine 600 mg/day for seizures which may help with mood.     She takes valium for anxiety.      She has no new seizures.  She is on Keppra 1000 mg twice a day and lamotrigine 300 mg twice daily.   On this dose she has not had breakthrough.  She has not had any more episodes of vertigo.    She has insomnia but is also sleepy - often in bed by 8 pm. .   She takes temazepam only some nights    MS History:   She presented with difficulties with speech, memory and gait balance in 2013. MRIs were consistent with MS.   In 2014, she had a severe episode of vertigo and was taken to the hospital. Repeat MRI imaging was performed and she was diagnosed officially with MS. She was started on Copaxone.   She received IV steroids couple times when she had severe fatigue.     She tolerates Copaxone well.    No difficulty with skin reactions.  I personally reviewed MRI images of the brain and spine.   In the posterior fossa there is a right middle cerebellar peduncle lesion, possible medulla focus and left midbrain focus and two small cerebellar  hemisphere foci. She has thalamic foci.  There are multiple periventricular, deep and juxtacortical white matter foci in the hemispheres. Some of these are oriented to the ventricles. Some are hypointense on T1-weighted images. None enhance.  C- Spine 10/19/2014 reported without plaques.  Repeat Brain MRI 02/03/2016 is unchanged.   Repeat Cervical spine MRI 02/03/2016 shows small no-nenhancing focus adjacent to C7T1 in the left posterolateral spinal cord.   In 2018, she started Vumerity due to breakthrough activity on Copaxone as part of a drug study but felt she did worse so switched to [place] and 2019.  She switched to [facility] May 2021 because travel to the office was difficult on a monthly basis.  IMAGING MRI of the brain 09/24/2017 shows multiple T2/FLAIR hypertense foci in the cerebellum, brainstem and cerebral hemispheres in a pattern and configuration consistent with chronic demyelinating plaque associated with multiple sclerosis.   None of the foci appears to be acute.  When compared to the MRI dated 05/10/2017, there is no interval change.     There is a normal enhancement pattern and there are no acute findings  MRI of the brain 12/12/2017 was unchanged.  MRI of the brain 08/18/2018 was unchanged.  MRI of the brain 04/25/2020 showed no new lesions.  MRI of the cervical spine 04/25/2020 showed 2 subtle foci, 1 to the left adjacent to C4 and 1 to the left adjacent to C7-T1.  These had been seen on previous MRIs.  No significant degenerative change.  REVIEW OF SYSTEMS: Constitutional: No fevers, chills, sweats, or change in appetite.  She has fatigue and insomnia Eyes: as above Ear, nose and throat: No hearing loss, ear pain, nasal congestion, sore throat.   She has vertigo. Cardiovascular: No chest pain, palpitations Respiratory:  No shortness of breath at rest or with exertion.   No wheezes GastrointestinaI: No nausea, vomiting, diarrhea, abdominal pain, fecal incontinence Genitourinal:   She has had multiple UTIs.  Musculoskeletal:  No neck pain.  She  notesback pain Integumentary: No rash, pruritus, skin lesions Neurological: as above Psychiatric: Notes mild depression at this time.  No anxiety Endocrine: No palpitations, diaphoresis, change in appetite, change in weigh or increased thirst Hematologic/Lymphatic:  No anemia, purpura, petechiae.   She feels that she bruises easily. Allergic/Immunologic: No itchy/runny eyes, nasal congestion, recent allergic reactions, rashes  ALLERGIES: Allergies  Allergen Reactions   Amoxicillin Shortness Of Breath   Penicillins Shortness Of Breath    Skin turns red   Bupropion Other (See Comments)    Mood changes    HOME MEDICATIONS:  Current Outpatient Medications:    albuterol (PROVENTIL) (2.5 MG/3ML) 0.083% nebulizer solution, Take 2.5 mg by nebulization every 6 (six) hours as needed for wheezing or shortness of breath., Disp: , Rfl:    Armodafinil 200 MG TABS, Take 1  tablet by mouth every morning., Disp: 30 tablet, Rfl: 5   baclofen (LIORESAL) 10 MG tablet, Take 1 tablet (10 mg total) by mouth 3 (three) times daily., Disp: 90 each, Rfl: 5   desvenlafaxine (PRISTIQ) 50 MG 24 hr tablet, Take 1 tablet (50 mg total) by mouth daily., Disp: 90 tablet, Rfl: 1   fluticasone furoate-vilanterol (BREO ELLIPTA) 100-25 MCG/INH AEPB, Inhale 1 puff into the lungs. , Disp: , Rfl:    lamoTRIgine (LAMICTAL) 100 MG tablet, Take 3 tablets (300 mg total) by mouth 2 (two) times daily., Disp: 180 tablet, Rfl: 5   meclizine (ANTIVERT) 25 MG tablet, Take up to 3 tablets per day as needed  for vertigo., Disp: 30 tablet, Rfl: 0   naproxen (NAPROSYN) 500 MG tablet, Take 500 mg by mouth 2 (two) times daily with a meal., Disp: , Rfl:    ocrelizumab (OCREVUS) 300 MG/10ML injection, Inject into the vein once., Disp: , Rfl:    ondansetron (ZOFRAN) 8 MG tablet, TAKE ONE TABLET BY MOUTH THREE TIMES A DAY AS NEEDED FOR NAUSEA AND VOMITING, Disp: 20 tablet, Rfl: 3   sulfamethoxazole-trimethoprim (BACTRIM DS) 800-160 MG tablet, Take 1 tablet by mouth 2 (two) times daily., Disp: 14 tablet, Rfl: 0   temazepam (RESTORIL) 15 MG capsule, Take 1 capsule (15 mg total) by mouth at bedtime as needed for sleep., Disp: 30 capsule, Rfl: 3   amphetamine-dextroamphetamine (ADDERALL) 30 MG tablet, Take 1 tablet by mouth 2 (two) times daily., Disp: 60 tablet, Rfl: 0   levETIRAcetam (KEPPRA) 1000 MG tablet, Take one po qAM and 1 1/2 po qHS (Patient not taking: Reported on 07/30/2021), Disp: 225 tablet, Rfl: 4  PAST MEDICAL HISTORY: Past Medical History:  Diagnosis Date   Chronic headaches    Multiple sclerosis (HCC)    Seizures (HCC)     PAST SURGICAL HISTORY: Past Surgical History:  Procedure Laterality Date   ABDOMINAL SURGERY     COSMETIC SURGERY      FAMILY HISTORY: Family History  Problem Relation Age of Onset   Cancer Father    Heart failure Paternal Grandmother     SOCIAL HISTORY:  Social  History   Socioeconomic History   Marital status: Married    Spouse name: Tayloranne Lekas   Number of children: 2   Years of education: 12   Highest education level: Not on file  Occupational History   Occupation: unemployeed  Tobacco Use   Smoking status: Former   Smokeless tobacco: Never   Tobacco comments:    quit 2010  Vaping Use   Vaping Use: Never used  Substance and Sexual Activity   Alcohol use: No    Alcohol/week: 0.0 standard drinks of alcohol   Drug use: No   Sexual activity: Yes    Partners: Male  Other Topics Concern   Not on file  Social History Narrative   Lives at home with husband and children   Drinks caffeine: 2 cups a day   Social Determinants of Health   Financial Resource Strain: Not on file  Food Insecurity: Not on file  Transportation Needs: Not on file  Physical Activity: Not on file  Stress: Not on file  Social Connections: Not on file  Intimate Partner Violence: Not on file     PHYSICAL EXAM  Vitals:   07/30/21 1053  BP: 118/74  Pulse: 94  Weight: 132 lb (59.9 kg)  Height: 5\' 2"  (1.575 m)    Body mass index is 24.14 kg/m.   General: The patient is well-developed and well-nourished and in no acute distress         Neurologic Exam  Mental status: The patient is alert and oriented x 3 at the time of the examination. The patient has apparent normal recent and remote memory, with reduced attention span and concentration ability.   Speech is normal.  Cranial nerves: She has nystagmus on lateral gaze...  Facial symmetry is present. Facial strength and sensation is normal. Trapezius strength is normal..   No obvious hearing deficits are noted.  Motor:  Muscle bulk is normal.  Muscle tone is mildly increased in the legs.. Strength is 5/5 in the arms and left leg and  4+/5 in the right foot.  Sensory: She has reduced sensation to touch on the right side and reduced vibration sensation on the left left..     Coordination:   Finger-nose-finger  and heel-to-shin is reduced,  worse on the right..  Rapid altering movements were performed symmetrically in the hands  Gait and station: Station is normal.  Gait is mildly wide.  Tandem gait is poor.  She has a mild right foot drop.  Romberg is negative.   Reflexes: Deep tendon reflexes are symmetric and increased with spread at the knees bilaterally   ASSESSMENT AND PLAN  1. Multiple sclerosis (HCC)   2. High risk medication use   3. Attention deficit disorder (ADD) in adult   4. Partial symptomatic epilepsy with complex partial seizures, intractable, without status epilepticus (HCC)   5. Other fatigue   6. Ataxic gait   7. Increased frequency of urination   8. Anxiety and depression       1.   Continue Ocrevus.  Check lab work today.  Around the time of her next visit we also need to check week heat MRI of the brain and cervical spine to determine if there is any subclinical progression and consider a different disease modifying therapy if this is happening. 2.  Continue medications for depression, ADD.   Refill Adderall.  3.   Due to the combination of her physical and cognitive impairments as well as her fatigue, I believe she is disabled.  She would not be expected to improve to a point that she could return to work.   4.   She will return to see in 6 months or sooner if new or worsening neurologic symptoms.    Suesan Mohrmann A. Epimenio Foot, MD, PhD, [place] 07/30/2021, 11:25 AM Certified in Neurology, Clinical Neurophysiology, Sleep Medicine, Pain Medicine and Neuroimaging  [facility] [address], [address] [place], [place] [address] [phone]

## 2021-07-31 LAB — CBC WITH DIFFERENTIAL/PLATELET
Basophils Absolute: 0.1 10*3/uL (ref 0.0–0.2)
Basos: 1 %
EOS (ABSOLUTE): 0.2 10*3/uL (ref 0.0–0.4)
Eos: 2 %
Hematocrit: 48 % — ABNORMAL HIGH (ref 34.0–46.6)
Hemoglobin: 16.1 g/dL — ABNORMAL HIGH (ref 11.1–15.9)
Immature Grans (Abs): 0 10*3/uL (ref 0.0–0.1)
Immature Granulocytes: 0 %
Lymphocytes Absolute: 2 10*3/uL (ref 0.7–3.1)
Lymphs: 28 %
MCH: 30.4 pg (ref 26.6–33.0)
MCHC: 33.5 g/dL (ref 31.5–35.7)
MCV: 91 fL (ref 79–97)
Monocytes Absolute: 0.6 10*3/uL (ref 0.1–0.9)
Monocytes: 9 %
Neutrophils Absolute: 4.2 10*3/uL (ref 1.4–7.0)
Neutrophils: 60 %
Platelets: 292 10*3/uL (ref 150–450)
RBC: 5.29 x10E6/uL — ABNORMAL HIGH (ref 3.77–5.28)
RDW: 12.7 % (ref 11.7–15.4)
WBC: 7 10*3/uL (ref 3.4–10.8)

## 2021-07-31 LAB — IGG, IGA, IGM
IgA/Immunoglobulin A, Serum: 200 mg/dL (ref 87–352)
IgG (Immunoglobin G), Serum: 790 mg/dL (ref 586–1602)
IgM (Immunoglobulin M), Srm: 121 mg/dL (ref 26–217)

## 2021-08-07 ENCOUNTER — Telehealth: Payer: Self-pay | Admitting: *Deleted

## 2021-08-07 NOTE — Telephone Encounter (Signed)
Faxed signed Ocrevus order/office notes back to [facility] at [phone]. Received fax confirmation.

## 2021-08-22 ENCOUNTER — Encounter: Payer: Self-pay | Admitting: Neurology

## 2021-08-29 ENCOUNTER — Other Ambulatory Visit: Payer: Self-pay | Admitting: Neurology

## 2021-08-29 MED ORDER — AMPHETAMINE-DEXTROAMPHETAMINE 30 MG PO TABS: 30.0000 mg | ORAL_TABLET | Freq: Two times a day (BID) | ORAL | 0 refills | Status: DC

## 2021-08-29 NOTE — Telephone Encounter (Signed)
Last seen 07/30/21 and next f/u 02/07/22.  Checked drug registry. Last refilled 07/31/21 #60.

## 2021-09-13 ENCOUNTER — Encounter: Payer: Self-pay | Admitting: Neurology

## 2021-09-18 ENCOUNTER — Encounter: Payer: Self-pay | Admitting: Neurology

## 2021-09-18 DIAGNOSIS — G35 Multiple sclerosis: Secondary | ICD-10-CM

## 2021-09-27 ENCOUNTER — Telehealth: Payer: Self-pay | Admitting: Neurology

## 2021-09-27 NOTE — Telephone Encounter (Signed)
Pt scheduled for MRI at [facility] 10/16/21 at 3:00 pm.  [facility] auth: [id number] (09/25/21-11/09/21)

## 2021-10-01 ENCOUNTER — Other Ambulatory Visit: Payer: Self-pay | Admitting: Neurology

## 2021-10-01 ENCOUNTER — Encounter: Payer: Self-pay | Admitting: Neurology

## 2021-10-01 MED ORDER — AMPHETAMINE-DEXTROAMPHETAMINE 30 MG PO TABS: 30.0000 mg | ORAL_TABLET | Freq: Two times a day (BID) | ORAL | 0 refills | Status: DC

## 2021-10-15 ENCOUNTER — Encounter: Payer: Self-pay | Admitting: Neurology

## 2021-10-16 ENCOUNTER — Other Ambulatory Visit: Payer: No Typology Code available for payment source

## 2021-10-16 NOTE — Telephone Encounter (Signed)
Called pt to reschedule, she states she is in the hospital with Covid and will call to reschedule once she recovers.

## 2021-10-27 ENCOUNTER — Encounter: Payer: Self-pay | Admitting: Neurology

## 2021-10-29 ENCOUNTER — Telehealth: Payer: Self-pay | Admitting: Neurology

## 2021-10-29 NOTE — Telephone Encounter (Signed)
Pt scheduled for MRI brain w/wo contrast at [facility] on 11/06/21 at 2:00pm   [employer] [id number] (09/25/21-11/09/21)

## 2021-11-02 ENCOUNTER — Other Ambulatory Visit: Payer: Self-pay | Admitting: Neurology

## 2021-11-05 ENCOUNTER — Other Ambulatory Visit: Payer: Self-pay | Admitting: *Deleted

## 2021-11-05 ENCOUNTER — Encounter: Payer: Self-pay | Admitting: Neurology

## 2021-11-05 MED ORDER — AMPHETAMINE-DEXTROAMPHETAMINE 30 MG PO TABS: 30.0000 mg | ORAL_TABLET | Freq: Two times a day (BID) | ORAL | 0 refills | Status: DC

## 2021-11-06 ENCOUNTER — Ambulatory Visit (INDEPENDENT_AMBULATORY_CARE_PROVIDER_SITE_OTHER): Payer: 59

## 2021-11-06 DIAGNOSIS — G35 Multiple sclerosis: Secondary | ICD-10-CM | POA: Diagnosis not present

## 2021-11-06 MED ORDER — GADOBENATE DIMEGLUMINE 529 MG/ML IV SOLN
10.0000 mL | Freq: Once | INTRAVENOUS | Status: AC | PRN
  Administered 2021-11-06: 10 mL via INTRAVENOUS

## 2021-11-07 ENCOUNTER — Encounter: Payer: Self-pay | Admitting: Neurology

## 2021-11-20 ENCOUNTER — Other Ambulatory Visit: Payer: Self-pay | Admitting: Neurology

## 2021-12-06 ENCOUNTER — Other Ambulatory Visit: Payer: Self-pay | Admitting: Neurology

## 2021-12-06 MED ORDER — AMPHETAMINE-DEXTROAMPHETAMINE 30 MG PO TABS: 30.0000 mg | ORAL_TABLET | Freq: Two times a day (BID) | ORAL | 0 refills | Status: DC

## 2021-12-06 NOTE — Telephone Encounter (Signed)
Pt last appt was on 6/12 and has a up coming appt on 12/21. Pt last refill in the registry was on 11/06/21.

## 2022-01-08 ENCOUNTER — Encounter: Payer: Self-pay | Admitting: Neurology

## 2022-01-08 ENCOUNTER — Other Ambulatory Visit: Payer: Self-pay | Admitting: Neurology

## 2022-01-08 MED ORDER — AMPHETAMINE-DEXTROAMPHETAMINE 30 MG PO TABS: 30.0000 mg | ORAL_TABLET | Freq: Two times a day (BID) | ORAL | 0 refills | Status: DC

## 2022-01-13 ENCOUNTER — Encounter: Payer: Self-pay | Admitting: Neurology

## 2022-02-01 ENCOUNTER — Other Ambulatory Visit: Payer: Self-pay | Admitting: Neurology

## 2022-02-04 ENCOUNTER — Other Ambulatory Visit: Payer: Self-pay | Admitting: *Deleted

## 2022-02-04 MED ORDER — AMPHETAMINE-DEXTROAMPHETAMINE 30 MG PO TABS: 30.0000 mg | ORAL_TABLET | Freq: Two times a day (BID) | ORAL | 0 refills | Status: DC

## 2022-02-07 ENCOUNTER — Encounter: Payer: Self-pay | Admitting: Neurology

## 2022-02-07 ENCOUNTER — Ambulatory Visit (INDEPENDENT_AMBULATORY_CARE_PROVIDER_SITE_OTHER): Payer: 59 | Admitting: Neurology

## 2022-02-07 VITALS — BP 118/72 | HR 105 | Ht 64.0 in | Wt 114.5 lb

## 2022-02-07 DIAGNOSIS — F988 Other specified behavioral and emotional disorders with onset usually occurring in childhood and adolescence: Secondary | ICD-10-CM | POA: Diagnosis not present

## 2022-02-07 DIAGNOSIS — G35 Multiple sclerosis: Secondary | ICD-10-CM

## 2022-02-07 DIAGNOSIS — R5383 Other fatigue: Secondary | ICD-10-CM | POA: Diagnosis not present

## 2022-02-07 DIAGNOSIS — G40219 Localization-related (focal) (partial) symptomatic epilepsy and epileptic syndromes with complex partial seizures, intractable, without status epilepticus: Secondary | ICD-10-CM

## 2022-02-07 NOTE — Progress Notes (Addendum)
Patient: Paula Anderson Date of Birth: March 11, 1975  Reason for Visit: Follow up History from: Patient, husband Primary Neurologist: Sater   ASSESSMENT AND PLAN 46 y.o. year old female   1.  Multiple sclerosis 2.  ADD 3.  Seizures 4.  Fatigue 5.  Anxiety and depression 6.  Recent admission for hypoxia, pneumonia  -MRI of the brain September 2023 showed no new lesions -Need to check CBC, IgG IgA IgM today -Next Ocrevus infusion February 2024, she needs to verify this -Will remain on chronic medications from our office -Plan to continue Ocrevus  -Continue PT, recent hospitalization for PNA has been a setback, she is deconditioned  -I would like to verify with her once labs result, usually her symptoms are on the right side, but today she reported more on the left, consider repeating imaging   Addendum 02/19/22 SS: She sent my chart message mentioning last infusion she confirmed was 09/19/21. Does feel more weakness to the left side since hospitalization but wants to hold off on imaging, will continue to work with PT. Monitor for any changes.   HISTORY OF PRESENT ILLNESS: Update February 07, 2022 SS: Here today with her husband, we reviewed recent hospitalization, also COVID admission in August 2023, she reports total of 3 COVID diagnoses this year.  -Admitted 12/2 to the hospital service with acute hypoxic respiratory failure secondary to multifocal pneumonia with sepsis. The patient received vancomycin and cefepime, ID consulted, has received IVIG's for borderline low IgG's, after that patient had worsening hypoxic respiratory failure and transferred to the ICU for Optiflow. There was consideration of a TRALI or TACO given the worsening hypoxia status after the IVIG infusion.  She was broadened further to cefepime, doxycycline, and voriconazole. CT of the chest showed multifocal pneumonia greater in the left lower lobe. To note patient is on ocrelizumab which could be associated with  cryptogenic organizing pneumonia according to the a few case studies. Infectious workup included blood bacterial culture, blood fungal culture, and urine culture, strep pneumo antigen, MRSA PCR, Fungitell all unremarkable. On 12/10 patient states she would like to leave AMA. She does have capacity. We discussed the risk and benefits of leaving AMA including worsening injury and death. I recommended the patient remain hospitalized to be further evaluated by infectious disease and pulmonology. Patient verbalized understanding of this risk of injury or death. Patient was discharged AMA with p.o. antibiotics and home O2. Her antibiotics at discharge will be p.o. doxycycline and Levaquin. She will be on a prednisone taper starting at 40 mg and will decrease by 20 mg weekly. IgG 540 01/20/22  Has been deconditioned since hospitalization, has stopped smoking, is now vaping.  Doing home PT.  Gets Ocrevus via home health, unclear last infusion, I believe around August 2023.  She is on disability, she gets up around 7AM. Needs assistance with ADLs, like to take a tub bath. Is a fall risk.  She has a walker, not using consistently. Can take takes too fast, lose balance. Left side is chronically weaker. B/B are fine. Uses bedside commode. Memory short term is poor.  For Adderall on 30 mg tablet, taking 3/4 tablet twice daily, goes to bed around 7, a full tablet BID kept her awake.  On home oxygen as needed. She feels since the hospitalization more weakness to left leg.  Had MRI of the brain with and without contrast September 2023 following COVID, no new lesions were seen. IgG 790, IgM 121 in June 2023.   11/06/21  IMPRESSION: This MRI of the brain with and without contrast shows the following: Multiple T2/FLAIR hyperintense foci in the brainstem, middle cerebellar peduncles, thalamus and cerebral hemispheres in a pattern consistent with chronic demyelinating plaque associated with multiple sclerosis.  None of the foci  enhanced or appear to be acute.  Compared to the MRI from 04/25/2020, there were no new lesions. No acute findings.  Normal enhancement pattern.   Update 6/12/203: She swtiched from Tysabri to ocrevus due to the JCV Ab converting to positive.  Her next infusion will be in August.   She has tolerated the Ocrevus well.   Gait is unsteady and has stumbled multiple times with one fall.       She hits the walls and furniture.   Derek Jack, she hit the stove and the gas was running and did not smell it.  Lots of stumbles but no recent falls in probably a year.     She feels she is pushed some to the left while walking and is more likely to fall that way.    And fairly normal but notes she drops items out of her right hand a lot.    Strength is about the same.  She denies numbness.    Bladder function is about the same.       She has a lot of fatigue daily, especially in the afternoons, despite Adderall 30 mg po twice a day (6 am and 4 pm)    She goes to bed at 7 pm if she does not take the Adderall or 12-1 am if she does.    For a while, she had difficulty getting Adderall.  She needs a refill.  Donepezil had not helped the cognitive issues and she stopped.     She notes some depression but this is better than a couple years ago..  She has anxiety.   Pristiq has not helped more than other medication.   Also on lamotrigine 600 mg/day for seizures which may help with mood.    She takes valium for anxiety.      She has no new seizures.  She is on Keppra 1000 mg twice a day and lamotrigine 300 mg twice daily.   On this dose she has not had breakthrough.   She has not had any more episodes of vertigo.     She has insomnia but is also sleepy - often in bed by 8 pm. .   She takes temazepam only some nights     MS History:   She presented with difficulties with speech, memory and gait balance in 2013. MRIs were consistent with MS.   In 2014, she had a severe episode of vertigo and was taken to the hospital.  Repeat MRI imaging was performed and she was diagnosed officially with MS. She was started on Copaxone.   She received IV steroids couple times when she had severe fatigue.     She tolerates Copaxone well.    No difficulty with skin reactions.  I personally reviewed MRI images of the brain and spine.   In the posterior fossa there is a right middle cerebellar peduncle lesion, possible medulla focus and left midbrain focus and two small cerebellar hemisphere foci. She has thalamic foci.  There are multiple periventricular, deep and juxtacortical white matter foci in the hemispheres. Some of these are oriented to the ventricles. Some are hypointense on T1-weighted images. None enhance.  C- Spine 10/19/2014 reported without plaques.  Repeat Brain MRI 02/03/2016  is unchanged.   Repeat Cervical spine MRI 02/03/2016 shows small no-nenhancing focus adjacent to C7T1 in the left posterolateral spinal cord.   In 2018, she started Vumerity due to breakthrough activity on Copaxone as part of a drug study but felt she did worse so switched to [place] and 2019.  She switched to [facility] May 2021 because travel to the office was difficult on a monthly basis.   IMAGING MRI of the brain 09/24/2017 shows multiple T2/FLAIR hypertense foci in the cerebellum, brainstem and cerebral hemispheres in a pattern and configuration consistent with chronic demyelinating plaque associated with multiple sclerosis.  None of the foci appears to be acute.  When compared to the MRI dated 05/10/2017, there is no interval change.     There is a normal enhancement pattern and there are no acute findings   MRI of the brain 12/12/2017 was unchanged.   MRI of the brain 08/18/2018 was unchanged.   MRI of the brain 04/25/2020 showed no new lesions.   MRI of the cervical spine 04/25/2020 showed 2 subtle foci, 1 to the left adjacent to C4 and 1 to the left adjacent to C7-T1.  These had been seen on previous MRIs.  No significant degenerative change.  REVIEW  OF SYSTEMS: Out of a complete 14 system review of symptoms, the patient complains only of the following symptoms, and all other reviewed systems are negative.  See HPI  ALLERGIES: Allergies  Allergen Reactions   Amoxicillin Shortness Of Breath   Penicillins Shortness Of Breath    Skin turns red   Bupropion Other (See Comments)    Mood changes    HOME MEDICATIONS: Outpatient Medications Prior to Visit  Medication Sig Dispense Refill   amphetamine-dextroamphetamine (ADDERALL) 30 MG tablet Take 1 tablet by mouth 2 (two) times daily. 60 tablet 0   Armodafinil 200 MG TABS Take 1 tablet by mouth every morning. 30 tablet 5   baclofen (LIORESAL) 10 MG tablet Take 1 tablet (10 mg total) by mouth 3 (three) times daily. 90 each 5   desvenlafaxine (PRISTIQ) 50 MG 24 hr tablet Take 1 tablet (50 mg total) by mouth daily. 90 tablet 1   lamoTRIgine (LAMICTAL) 100 MG tablet TAKE THREE TABLETS BY MOUTH TWICE A DAY 180 tablet 3   levETIRAcetam (KEPPRA) 1000 MG tablet Take one po qAM and 1 1/2 po qHS 225 tablet 4   meclizine (ANTIVERT) 25 MG tablet Take up to 3 tablets per day as needed for vertigo. 30 tablet 0   ocrelizumab (OCREVUS) 300 MG/10ML injection Inject into the vein once.     ondansetron (ZOFRAN) 8 MG tablet TAKE ONE TABLET BY MOUTH THREE TIMES A DAY AS NEEDED FOR NAUSEA AND VOMITING 20 tablet 3   sulfamethoxazole-trimethoprim (BACTRIM DS) 800-160 MG tablet Take 1 tablet by mouth 2 (two) times daily. 14 tablet 0   temazepam (RESTORIL) 15 MG capsule Take 1 capsule (15 mg total) by mouth at bedtime as needed for sleep. 30 capsule 3   albuterol (PROVENTIL) (2.5 MG/3ML) 0.083% nebulizer solution Take 2.5 mg by nebulization every 6 (six) hours as needed for wheezing or shortness of breath.     fluticasone furoate-vilanterol (BREO ELLIPTA) 100-25 MCG/INH AEPB Inhale 1 puff into the lungs.      naproxen (NAPROSYN) 500 MG tablet Take 500 mg by mouth 2 (two) times daily with a meal.     No  facility-administered medications prior to visit.    PAST MEDICAL HISTORY: Past Medical History:  Diagnosis Date  Chronic headaches    Multiple sclerosis (HCC)    Seizures (HCC)     PAST SURGICAL HISTORY: Past Surgical History:  Procedure Laterality Date   ABDOMINAL SURGERY     COSMETIC SURGERY      FAMILY HISTORY: Family History  Problem Relation Age of Onset   Cancer Father    Heart failure Paternal Grandmother     SOCIAL HISTORY: Social History   Socioeconomic History   Marital status: Married    Spouse name: Richard   Number of children: 2   Years of education: 12   Highest education level: Not on file  Occupational History   Occupation: unemployeed  Tobacco Use   Smoking status: Former   Smokeless tobacco: Never   Tobacco comments:    quit 2010  Vaping Use   Vaping Use: Never used  Substance and Sexual Activity   Alcohol use: No    Alcohol/week: 0.0 standard drinks of alcohol   Drug use: No   Sexual activity: Yes    Partners: Male  Other Topics Concern   Not on file  Social History Narrative   Lives at home with husband and children   Drinks caffeine: 2 cups a day   Social Determinants of Health   Financial Resource Strain: Not on file  Food Insecurity: Not on file  Transportation Needs: Not on file  Physical Activity: Not on file  Stress: Not on file  Social Connections: Not on file  Intimate Partner Violence: Not on file   PHYSICAL EXAM  Vitals:   02/07/22 1354  BP: 118/72  Pulse: (!) 105  Weight: 114 lb 8 oz (51.9 kg)  Height:  (1.626 m)   Body mass index is 19.65 kg/m.  Generalized: Well developed, in no acute distress  Neurological examination  Mentation: Alert oriented, she relies on her husband for some history, delayed response . Follows all commands speech and language fluent Cranial nerve II-XII: Pupils were equal round reactive to light. Extraocular movements were full, visual field were full on confrontational  test. Facial sensation and strength were normal. Head turning and shoulder shrug  were normal and symmetric. Motor: 4 out of 5 bilateral hip flexion, more trouble with the left side  Sensory: Decreased soft sensation to the left side Coordination: Dysmetria with finger-nose-finger bilaterally more on the right  Gait and station: Able to stand independently, needs assistance to the exam table, tends to limp on the left  Reflexes: Deep tendon reflexes are symmetric but increased at the knees  DIAGNOSTIC DATA (LABS, IMAGING, TESTING) - I reviewed patient records, labs, notes, testing and imaging myself where available.  Lab Results  Component Value Date   WBC 7.0 07/30/2021   HGB 16.1 (H) 07/30/2021   HCT 48.0 (H) 07/30/2021   MCV 91 07/30/2021   PLT 292 07/30/2021      Component Value Date/Time   NA 138 08/05/2018 1331   K 5.1 08/05/2018 1331   CL 98 08/05/2018 1331   CO2 23 08/05/2018 1331   GLUCOSE 89 08/05/2018 1331   BUN 13 08/05/2018 1331   CREATININE 1.00 08/05/2018 1331   CALCIUM 10.4 (H) 08/05/2018 1331   PROT 6.6 03/30/2019 1428   ALBUMIN 3.9 03/30/2019 1428   AST 14 03/30/2019 1428   ALT 13 03/30/2019 1428   ALKPHOS 89 03/30/2019 1428   BILITOT 0.3 03/30/2019 1428   GFRNONAA 68 08/05/2018 1331   GFRAA 79 08/05/2018 1331   No results found for: "CHOL", "HDL", "LDLCALC", "LDLDIRECT", "  TRIG", "CHOLHDL" No results found for: "HGBA1C" Lab Results  Component Value Date   VITAMINB12 >2000 (H) 08/05/2014   Lab Results  Component Value Date   TSH 1.220 01/13/2017    Margie Ege, AGNP-C, DNP 02/07/2022, 2:58 PM [facility] [address], [address] [place], [place] [address] [phone]

## 2022-02-08 ENCOUNTER — Encounter: Payer: Self-pay | Admitting: Neurology

## 2022-02-08 LAB — CBC WITH DIFFERENTIAL/PLATELET
Basophils Absolute: 0 10*3/uL (ref 0.0–0.2)
Basos: 0 %
EOS (ABSOLUTE): 0 10*3/uL (ref 0.0–0.4)
Eos: 0 %
Hematocrit: 38.8 % (ref 34.0–46.6)
Hemoglobin: 12.8 g/dL (ref 11.1–15.9)
Immature Grans (Abs): 0.1 10*3/uL (ref 0.0–0.1)
Immature Granulocytes: 1 %
Lymphocytes Absolute: 0.6 10*3/uL — ABNORMAL LOW (ref 0.7–3.1)
Lymphs: 7 %
MCH: 29.3 pg (ref 26.6–33.0)
MCHC: 33 g/dL (ref 31.5–35.7)
MCV: 89 fL (ref 79–97)
Monocytes Absolute: 0.3 10*3/uL (ref 0.1–0.9)
Monocytes: 3 %
Neutrophils Absolute: 7.4 10*3/uL — ABNORMAL HIGH (ref 1.4–7.0)
Neutrophils: 89 %
Platelets: 336 10*3/uL (ref 150–450)
RBC: 4.37 x10E6/uL (ref 3.77–5.28)
RDW: 15.3 % (ref 11.7–15.4)
WBC: 8.4 10*3/uL (ref 3.4–10.8)

## 2022-02-08 LAB — IGG, IGA, IGM
IgA/Immunoglobulin A, Serum: 149 mg/dL (ref 87–352)
IgG (Immunoglobin G), Serum: 672 mg/dL (ref 586–1602)
IgM (Immunoglobulin M), Srm: 82 mg/dL (ref 26–217)

## 2022-02-11 ENCOUNTER — Encounter: Payer: Self-pay | Admitting: Neurology

## 2022-02-15 ENCOUNTER — Encounter: Payer: Self-pay | Admitting: Neurology

## 2022-02-19 MED ORDER — ONDANSETRON HCL 8 MG PO TABS: ORAL_TABLET | ORAL | 3 refills | Status: DC

## 2022-02-19 MED ORDER — MECLIZINE HCL 25 MG PO TABS: ORAL_TABLET | ORAL | 0 refills | Status: AC

## 2022-02-26 ENCOUNTER — Encounter: Payer: Self-pay | Admitting: Neurology

## 2022-03-13 ENCOUNTER — Encounter: Payer: Self-pay | Admitting: Neurology

## 2022-03-21 ENCOUNTER — Encounter: Payer: Self-pay | Admitting: Neurology

## 2022-03-26 ENCOUNTER — Other Ambulatory Visit: Payer: Self-pay | Admitting: Neurology

## 2022-03-26 ENCOUNTER — Encounter: Payer: Self-pay | Admitting: Neurology

## 2022-03-26 MED ORDER — TEMAZEPAM 15 MG PO CAPS: 15.0000 mg | ORAL_CAPSULE | Freq: Every evening | ORAL | 3 refills | Status: DC | PRN

## 2022-04-04 ENCOUNTER — Other Ambulatory Visit: Payer: Self-pay | Admitting: *Deleted

## 2022-04-04 ENCOUNTER — Encounter: Payer: Self-pay | Admitting: Neurology

## 2022-04-04 MED ORDER — AMPHETAMINE-DEXTROAMPHETAMINE 30 MG PO TABS: 30.0000 mg | ORAL_TABLET | Freq: Two times a day (BID) | ORAL | 0 refills | Status: DC

## 2022-04-04 NOTE — Telephone Encounter (Signed)
Last seen 02/07/22. Per drug registry, last refilled 02/07/22 #60

## 2022-04-19 ENCOUNTER — Encounter: Payer: Self-pay | Admitting: Neurology

## 2022-04-25 ENCOUNTER — Encounter: Payer: Self-pay | Admitting: Neurology

## 2022-04-25 ENCOUNTER — Other Ambulatory Visit: Payer: Self-pay | Admitting: *Deleted

## 2022-04-25 MED ORDER — AMPHETAMINE-DEXTROAMPHETAMINE 30 MG PO TABS: 30.0000 mg | ORAL_TABLET | Freq: Two times a day (BID) | ORAL | 0 refills | Status: DC

## 2022-04-25 NOTE — Telephone Encounter (Signed)
Pt last seen 02/07/22. Per drug registry, last refilled    Dextroamp-Amphetamin 30 Mg Tab 04/05/22 #60.

## 2022-04-30 ENCOUNTER — Encounter: Payer: Self-pay | Admitting: Neurology

## 2022-04-30 ENCOUNTER — Other Ambulatory Visit: Payer: Self-pay | Admitting: Neurology

## 2022-05-01 ENCOUNTER — Other Ambulatory Visit: Payer: Self-pay | Admitting: Neurology

## 2022-05-01 MED ORDER — DIAZEPAM 5 MG PO TABS: ORAL_TABLET | ORAL | 0 refills | Status: DC

## 2022-06-03 ENCOUNTER — Other Ambulatory Visit: Payer: Self-pay | Admitting: Neurology

## 2022-06-04 NOTE — Telephone Encounter (Signed)
Last seen on 07/30/21 Follow up scheduled on 09/04/22 Last filled on 04/30/22 # 180 tablets (30 day supply)

## 2022-06-15 ENCOUNTER — Encounter: Payer: Self-pay | Admitting: Neurology

## 2022-06-17 MED ORDER — AMPHETAMINE-DEXTROAMPHETAMINE 30 MG PO TABS: 30.0000 mg | ORAL_TABLET | Freq: Two times a day (BID) | ORAL | 0 refills | Status: DC

## 2022-06-17 NOTE — Telephone Encounter (Addendum)
Dr.Sater patient your are work in provider today.   Patient last seen on 02/07/22 per note on 07/30/21  Continue medications for depression, ADD.   Refill Adderall. " Follow up scheduled on 09/04/22 Last filled on 05/04/22 Rx pending to be signed

## 2022-07-09 ENCOUNTER — Telehealth: Payer: Self-pay | Admitting: *Deleted

## 2022-07-09 NOTE — Telephone Encounter (Signed)
Faxed signed Ocrevus order/office notes/labs to [facility] at [phone]. Received fax confirmation.

## 2022-07-21 ENCOUNTER — Encounter: Payer: Self-pay | Admitting: Neurology

## 2022-07-22 ENCOUNTER — Other Ambulatory Visit: Payer: Self-pay | Admitting: *Deleted

## 2022-07-22 MED ORDER — ETODOLAC 400 MG PO TABS: 400.0000 mg | ORAL_TABLET | Freq: Two times a day (BID) | ORAL | 3 refills | Status: AC

## 2022-07-22 MED ORDER — AMPHETAMINE-DEXTROAMPHETAMINE 30 MG PO TABS: 30.0000 mg | ORAL_TABLET | Freq: Two times a day (BID) | ORAL | 0 refills | Status: DC

## 2022-07-22 NOTE — Telephone Encounter (Signed)
Last seen 02/07/22 and next f/u 09/04/22.  Last refilled 06/17/22 #60.

## 2022-08-05 ENCOUNTER — Other Ambulatory Visit: Payer: Self-pay | Admitting: Neurology

## 2022-08-05 NOTE — Telephone Encounter (Signed)
Pt last seen on 02/07/22 Follow up scheduled on 09/04/22 Last filled on 05/01/22 #12 tablets (12 day supply) See 04/30/22 patient message "Rx was last filled for severe anxiety or travel" Rx pending to be signed

## 2022-08-22 ENCOUNTER — Encounter: Payer: Self-pay | Admitting: Neurology

## 2022-08-22 ENCOUNTER — Other Ambulatory Visit: Payer: Self-pay | Admitting: Neurology

## 2022-08-26 ENCOUNTER — Encounter: Payer: Self-pay | Admitting: Neurology

## 2022-08-26 MED ORDER — AMPHETAMINE-DEXTROAMPHETAMINE 30 MG PO TABS: 30.0000 mg | ORAL_TABLET | Freq: Two times a day (BID) | ORAL | 0 refills | Status: DC

## 2022-08-26 MED ORDER — DIAZEPAM 5 MG PO TABS: ORAL_TABLET | ORAL | 0 refills | Status: AC

## 2022-08-26 NOTE — Telephone Encounter (Signed)
Pt last seen on 02/07/22 Follow up scheduled on 09/04/22 Adderall last filled on 07/22/22 #60 (30 tablets) Rx pending to be signed  Pt sent a separate message saying she did not need the diazepam 5 mg refilled, please deny.

## 2022-09-03 ENCOUNTER — Encounter: Payer: Self-pay | Admitting: Neurology

## 2022-09-04 ENCOUNTER — Telehealth (INDEPENDENT_AMBULATORY_CARE_PROVIDER_SITE_OTHER): Payer: 59 | Admitting: Neurology

## 2022-09-04 ENCOUNTER — Encounter: Payer: Self-pay | Admitting: Neurology

## 2022-09-04 ENCOUNTER — Telehealth: Payer: Self-pay | Admitting: *Deleted

## 2022-09-04 DIAGNOSIS — G40219 Localization-related (focal) (partial) symptomatic epilepsy and epileptic syndromes with complex partial seizures, intractable, without status epilepticus: Secondary | ICD-10-CM

## 2022-09-04 DIAGNOSIS — F988 Other specified behavioral and emotional disorders with onset usually occurring in childhood and adolescence: Secondary | ICD-10-CM

## 2022-09-04 DIAGNOSIS — R260 Ataxic gait: Secondary | ICD-10-CM

## 2022-09-04 DIAGNOSIS — G35 Multiple sclerosis: Secondary | ICD-10-CM | POA: Diagnosis not present

## 2022-09-04 DIAGNOSIS — R350 Frequency of micturition: Secondary | ICD-10-CM

## 2022-09-04 DIAGNOSIS — Z79899 Other long term (current) drug therapy: Secondary | ICD-10-CM

## 2022-09-04 DIAGNOSIS — R5383 Other fatigue: Secondary | ICD-10-CM

## 2022-09-04 MED ORDER — VILAZODONE HCL 10 MG PO TABS: 10.0000 mg | ORAL_TABLET | Freq: Every day | ORAL | 0 refills | Status: DC

## 2022-09-04 MED ORDER — VILAZODONE HCL 20 MG PO TABS: ORAL_TABLET | ORAL | 11 refills | Status: DC

## 2022-09-04 NOTE — Telephone Encounter (Signed)
-----   Message from Asa Lente sent at 09/04/2022 10:36 AM EDT ----- 6 months

## 2022-09-04 NOTE — Progress Notes (Signed)
Marland Kitchen   [facility]  PATIENT: Paula Anderson DOB: Nov 08, 1975  REFERRING DOCTOR OR PCP:  Delbert Harness    Fax [phone] ______________________________   HISTORICAL  CHIEF COMPLAINT:  Chief Complaint  Patient presents with   Multiple Sclerosis   Seizures     HISTORY OF PRESENT ILLNESS:  Paula Anderson is a 47 y.o. woman with relapsing remitting multiple sclerosis and seizures.    Virtual Visit via Video Note I connected with Whitney Muse on 09/04/22 at 10:00 AM EDT by a video enabled telemedicine application and verified that I am speaking with the correct person.  I discussed the limitations of evaluation and management by telemedicine and the availability of in person appointments. The patient expressed understanding and agreed to proceed.  Patient was at home and the provider was in the office.  Update 09/03/2021: Her last Ocrevus infusion was 03/2022 and she tolerated it well.       She swtiched from Tysabri to Ocrevus due to the JCV Ab converting to positive.  She should be getting her next infusion in August.     She had a hospitalization in November or December for pneumonia and notes she has little memory of this.  She also has tobacco history and COPD diagnosis  She has a reduced gait due to right > left sided weakness and some ataxia.     She hits the walls and furniture at times.   She had one fall.   She feels she is pushed some to the right  while walking and is more likely to fall that way.    She has reduced right hand coordination and she drops items out of her right hand a lot.   Strength is ok.  Handwriting is poor.    She denies numbness.    Bladder function is about the same.      She continues to report fatigue daily, especially in the afternoons, despite Adderall 30 mg po twice a day (6 am and 4 pm)    She goes to bed at 7 pm if she does not take the Adderall or 12-1 am if she does.  She sleeps well every night.  She gets about 7 hours many nights    She snores a little only   She has some depression but no recent crying spells.. She also has anxiety.    She notes being short tempered and irritable.   Pristiq has not helped more than other medication and she stopped.   Also on lamotrigine 600 mg/day.    She takes valium for anxiety.      She has no definite new seizures .  She is on Keppra 1000 mg twice a day and lamotrigine 300 mg twice daily.     PE: She is a well-developed well-nourished woman in no acute distress.  The head is normocephalic and atraumatic.  Sclera are anicteric.  Visible skin appears normal.  The neck has a good range of motion.     She is alert and fully oriented with fluent speech and good attention, knowledge and memory.  Extraocular muscles are intact.  Facial strength is normal.   She appears to have normal strength in the arms.  Rapid alternating movements and finger-nose-finger are performed well.    MS History:   She presented with difficulties with speech, memory and gait balance in 2013. MRIs were consistent with MS.   In 2014, she had a severe episode of vertigo and was taken to the  hospital. Repeat MRI imaging was performed and she was diagnosed officially with MS. She was started on Copaxone.   She received IV steroids couple times when she had severe fatigue.     She tolerates Copaxone well.    No difficulty with skin reactions.  I personally reviewed MRI images of the brain and spine.   In the posterior fossa there is a right middle cerebellar peduncle lesion, possible medulla focus and left midbrain focus and two small cerebellar hemisphere foci. She has thalamic foci.  There are multiple periventricular, deep and juxtacortical white matter foci in the hemispheres. Some of these are oriented to the ventricles. Some are hypointense on T1-weighted images. None enhance.  C- Spine 10/19/2014 reported without plaques.  Repeat Brain MRI 02/03/2016 is unchanged.   Repeat Cervical spine MRI 02/03/2016 shows small  no-nenhancing focus adjacent to C7T1 in the left posterolateral spinal cord.   In 2018, she started Vumerity due to breakthrough activity on Copaxone as part of a drug study but felt she did worse so switched to [place] and 2019.  She switched to [facility] May 2021 because travel to the office was difficult on a monthly basis. She had pneumonia 12/2021  IMAGING MRI of the brain 09/24/2017 shows multiple T2/FLAIR hypertense foci in the cerebellum, brainstem and cerebral hemispheres in a pattern and configuration consistent with chronic demyelinating plaque associated with multiple sclerosis.  None of the foci appears to be acute.  When compared to the MRI dated 05/10/2017, there is no interval change.     There is a normal enhancement pattern and there are no acute findings  MRI of the brain 12/12/2017 was unchanged.  MRI of the brain 08/18/2018 was unchanged.  REVIEW OF SYSTEMS: Constitutional: No fevers, chills, sweats, or change in appetite.  She has fatigue and insomnia Eyes: as above Ear, nose and throat: No hearing loss, ear pain, nasal congestion, sore throat.   She has vertigo. Cardiovascular: No chest pain, palpitations Respiratory:  No shortness of breath at rest or with exertion.   No wheezes GastrointestinaI: No nausea, vomiting, diarrhea, abdominal pain, fecal incontinence Genitourinal:   She has had multiple UTIs.  Musculoskeletal:  No neck pain.  She  notesback pain Integumentary: No rash, pruritus, skin lesions Neurological: as above Psychiatric: Notes mild depression at this time.  No anxiety Endocrine: No palpitations, diaphoresis, change in appetite, change in weigh or increased thirst Hematologic/Lymphatic:  No anemia, purpura, petechiae.   She feels that she bruises easily. Allergic/Immunologic: No itchy/runny eyes, nasal congestion, recent allergic reactions, rashes  ALLERGIES: Allergies  Allergen Reactions   Amoxicillin Shortness Of Breath   Penicillins Shortness Of  Breath    Skin turns red   Bupropion Other (See Comments)    Mood changes    HOME MEDICATIONS:  Current Outpatient Medications:    Vilazodone HCl (VIIBRYD) 10 MG TABS, Take 1 tablet (10 mg total) by mouth daily., Disp: 7 tablet, Rfl: 0   Vilazodone HCl 20 MG TABS, Begin after completing the 10 mg pills.   One po qd, Disp: 30 tablet, Rfl: 11   amphetamine-dextroamphetamine (ADDERALL) 30 MG tablet, Take 1 tablet by mouth 2 (two) times daily., Disp: 60 tablet, Rfl: 0   diazepam (VALIUM) 5 MG tablet, TAKE ONE TABLET BY MOUTH ONE TIME DAILY AS NEEDED FOR SEVERE ANXIETY OR TRAVEL, Disp: 12 tablet, Rfl: 0   etodolac (LODINE) 400 MG tablet, Take 1 tablet (400 mg total) by mouth 2 (two) times daily., Disp: 60 tablet, Rfl:  3   lamoTRIgine (LAMICTAL) 100 MG tablet, TAKE THREE TABLETS BY MOUTH TWICE A DAY, Disp: 180 tablet, Rfl: 1   levETIRAcetam (KEPPRA) 1000 MG tablet, Take one po qAM and 1 1/2 po qHS, Disp: 225 tablet, Rfl: 4   meclizine (ANTIVERT) 25 MG tablet, Take up to 3 tablets per day as needed for vertigo., Disp: 30 tablet, Rfl: 0   ocrelizumab (OCREVUS) 300 MG/10ML injection, Inject into the vein once., Disp: , Rfl:    ondansetron (ZOFRAN) 8 MG tablet, TAKE ONE TABLET BY MOUTH THREE TIMES A DAY AS NEEDED FOR NAUSEA AND VOMITING, Disp: 20 tablet, Rfl: 3   temazepam (RESTORIL) 15 MG capsule, Take 1 capsule (15 mg total) by mouth at bedtime as needed for sleep., Disp: 30 capsule, Rfl: 3  PAST MEDICAL HISTORY: Past Medical History:  Diagnosis Date   Chronic headaches    Multiple sclerosis (HCC)    Seizures (HCC)     PAST SURGICAL HISTORY: Past Surgical History:  Procedure Laterality Date   ABDOMINAL SURGERY     COSMETIC SURGERY      FAMILY HISTORY: Family History  Problem Relation Age of Onset   Cancer Father    Heart failure Paternal Grandmother     SOCIAL HISTORY:  Social History   Socioeconomic History   Marital status: Married    Spouse name: Ayris Carano   Number of  children: 2   Years of education: 12   Highest education level: Not on file  Occupational History   Occupation: unemployeed  Tobacco Use   Smoking status: Former   Smokeless tobacco: Never   Tobacco comments:    quit 2010  Vaping Use   Vaping status: Never Used  Substance and Sexual Activity   Alcohol use: No    Alcohol/week: 0.0 standard drinks of alcohol   Drug use: No   Sexual activity: Yes    Partners: Male  Other Topics Concern   Not on file  Social History Narrative   Lives at home with husband and children   Drinks caffeine: 2 cups a day   Social Determinants of Health   Financial Resource Strain: Low Risk  (10/15/2021)   Received from [facility], [facility]   Overall Financial Resource Strain (CARDIA)    Difficulty of Paying Living Expenses: Not hard at all  Food Insecurity: Low Risk  (01/19/2022)   Received from [facility] visits prior to 04/20/2022.   Food    Within the past 12 months, you worried that your food would run out before you got money to buy more food: Never true    Within the past 12 months, the food you bought just didn't last and you didn't have money to get more: Never true  Transportation Needs: No Transportation Needs (01/19/2022)   [facility]   Transportation    In the past 12 months, has lack of reliable transportation kept you from medical appointments, meetings, work or from getting things needed for daily living? : No  Physical Activity: Inactive (04/11/2021)   Received from [facility], [facility]   Exercise Vital Sign    Days of Exercise per Week: 1 day    Minutes of Exercise per Session: 0 min  Stress: Stress Concern Present (04/11/2021)   Received from [facility], [facility] Occupational Health - Occupational Stress Questionnaire    Feeling of Stress : Very much  Social Connections: Unknown (06/26/2021)   Received  from [facility], [facility]    Social Network    Social Network: Not on file  Recent Concern: Social Connections - Moderately Isolated (04/11/2021)   Received from [facility], [facility]   Social Connection and Isolation Panel [NHANES]    Frequency of Communication with Friends and Family: Three times a week    Frequency of Social Gatherings with Friends and Family: Never    Attends Religious Services: Never    Database administrator or Organizations: No    Attends Banker Meetings: Never    Marital Status: Married  Catering manager Violence: Low Risk  (01/19/2022)   Received from [facility] visits prior to 04/20/2022.   Safety    How often does anyone, including family and friends, physically hurt you?: Never    How often does anyone, including family and friends, insult or talk down to you?: Never    How often does anyone, including family and friends, threaten you with harm?: Never    How often does anyone, including family and friends, scream or curse at you?: Never     PHYSICAL EXAM  There were no vitals filed for this visit.   There is no height or weight on file to calculate BMI.   General: The patient is well-developed and well-nourished and in no acute distress         Neurologic Exam  Mental status: The patient is alert and oriented x 3 at the time of the examination. The patient has apparent normal recent and remote memory, with reduced attention span and concentration ability.   Speech is normal.  Cranial nerves: She has nystagmus on lateral gaze...  Facial symmetry is present. Facial strength and sensation is normal. Trapezius strength is normal..   No obvious hearing deficits are noted.  Motor:  Muscle bulk is normal.  Muscle tone is mildly increased in the legs.. Strength is 5/5 in the arms and left leg and 4+/5 in the right foot.  Sensory: She has reduced sensation to touch on the right side and reduced vibration sensation on the left left..      Coordination:   Finger-nose-finger and heel-to-shin is reduced,  worse on the right..  Rapid altering movements were performed a little better on the left than the right.  Gait and station: Station is normal.  Gait is mildly wide.  Tandem gait is very poor and severe left. She does not need support.  She has a mild right foot drop.  Romberg is negative.   Reflexes: Deep tendon reflexes are symmetric and increased (spread at knees) bilaterally.     ASSESSMENT AND PLAN  1. Multiple sclerosis (HCC)   2. Partial symptomatic epilepsy with complex partial seizures, intractable, without status epilepticus (HCC)   3. Attention deficit disorder (ADD) in adult   4. Other fatigue   5. High risk medication use   6. Ataxic gait   7. Increased frequency of urination        1.  Continue Ocrevus.  Next infusion will be August.  We will check lab work at [facility] 2.  Continue medications for ADD.  Add Viibryd for depression.  If worsens, refer to psychiatry 3.   Due to the combination of her physical and cognitive impairments as well as her fatigue, she is disabled.  She would not be expected to improve to a point that she could return to work.   4.   Continue Adderall for sleepiness  and fatigue.   5.  She will return to see in 6 months or sooner if new or worsening neurologic symptoms.     Follow Up Instructions: I discussed the assessment and treatment plan with the patient. The patient was provided an opportunity to ask questions and all were answered. The patient agreed with the plan and demonstrated an understanding of the instructions.    The patient was advised to call back or seek an in-person evaluation if the symptoms worsen or if the condition fails to improve as anticipated.  I provided 27 minutes of non-face-to-face time during this encounter.  Marsela Kuan A. Epimenio Foot, MD, PhD, [place] 09/04/2022, 10:35 AM Certified in Neurology, Clinical Neurophysiology, Sleep Medicine, Pain Medicine  and Neuroimaging  [facility] [address] [place], [place] [address] [phone]

## 2022-09-05 NOTE — Telephone Encounter (Signed)
Appt made for pt 03-13-2023 6 mo follow up.

## 2022-09-06 ENCOUNTER — Other Ambulatory Visit (HOSPITAL_COMMUNITY): Payer: Self-pay

## 2022-09-09 ENCOUNTER — Telehealth: Payer: Self-pay

## 2022-09-09 ENCOUNTER — Other Ambulatory Visit (HOSPITAL_COMMUNITY): Payer: Self-pay

## 2022-09-09 NOTE — Telephone Encounter (Addendum)
Pharmacy Patient Advocate Encounter  Received notification from [facility] that Prior Authorization for Vilazodone HCl 10MG  tablets has been APPROVED from 08/10/2022 to 09/09/2023.Marland Kitchen  PA #/Case ID/Reference #: PA Case ID: [id number] Key: [id number]  Copay is $10 per [facility] test claim.  Tried to submit for the 20MG  tablets but states PA is already on file for the 10mg -ran a test claim for the 20mg  tablet s and they went thru successfully for a 30ds.

## 2022-09-09 NOTE — Telephone Encounter (Signed)
Sorry [place] the PA pool didn't come up so I could route the reply to you.

## 2022-09-24 ENCOUNTER — Encounter: Payer: Self-pay | Admitting: Neurology

## 2022-09-25 ENCOUNTER — Other Ambulatory Visit: Payer: Self-pay

## 2022-09-25 MED ORDER — AMPHETAMINE-DEXTROAMPHETAMINE 30 MG PO TABS: 30.0000 mg | ORAL_TABLET | Freq: Two times a day (BID) | ORAL | 0 refills | Status: DC

## 2022-10-23 ENCOUNTER — Encounter: Payer: Self-pay | Admitting: Neurology

## 2022-10-31 ENCOUNTER — Other Ambulatory Visit: Payer: Self-pay | Admitting: Neurology

## 2022-10-31 ENCOUNTER — Encounter: Payer: Self-pay | Admitting: Neurology

## 2022-10-31 ENCOUNTER — Other Ambulatory Visit: Payer: Self-pay

## 2022-10-31 MED ORDER — AMPHETAMINE-DEXTROAMPHETAMINE 30 MG PO TABS: 30.0000 mg | ORAL_TABLET | Freq: Two times a day (BID) | ORAL | 0 refills | Status: DC

## 2022-10-31 NOTE — Telephone Encounter (Signed)
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

## 2022-10-31 NOTE — Telephone Encounter (Signed)
Pt Last seen 09/04/2022 Upcoming Appointment 03/13/2023  Adderall Last Filled 09/25/2022 Escript 10/31/2022

## 2022-11-04 ENCOUNTER — Other Ambulatory Visit: Payer: Self-pay | Admitting: *Deleted

## 2022-11-04 DIAGNOSIS — G35 Multiple sclerosis: Secondary | ICD-10-CM

## 2022-11-05 ENCOUNTER — Other Ambulatory Visit: Payer: Self-pay | Admitting: *Deleted

## 2022-11-05 DIAGNOSIS — G35 Multiple sclerosis: Secondary | ICD-10-CM

## 2022-11-05 NOTE — Telephone Encounter (Signed)
Labs orders released for CBC and IGG,IGA,IGM to [facility].

## 2022-11-18 ENCOUNTER — Other Ambulatory Visit: Payer: Self-pay | Admitting: Neurology

## 2022-11-18 LAB — CBC WITH DIFFERENTIAL/PLATELET
Absolute Monocytes: 568 {cells}/uL (ref 200–950)
Basophils Absolute: 50 {cells}/uL (ref 0–200)
Basophils Relative: 0.7 %
Eosinophils Absolute: 78 {cells}/uL (ref 15–500)
Eosinophils Relative: 1.1 %
HCT: 45.4 % — ABNORMAL HIGH (ref 35.0–45.0)
Hemoglobin: 14.9 g/dL (ref 11.7–15.5)
Lymphs Abs: 2237 {cells}/uL (ref 850–3900)
MCH: 31.1 pg (ref 27.0–33.0)
MCHC: 32.8 g/dL (ref 32.0–36.0)
MCV: 94.8 fL (ref 80.0–100.0)
MPV: 11.1 fL (ref 7.5–12.5)
Monocytes Relative: 8 %
Neutro Abs: 4168 {cells}/uL (ref 1500–7800)
Neutrophils Relative %: 58.7 %
Platelets: 322 10*3/uL (ref 140–400)
RBC: 4.79 10*6/uL (ref 3.80–5.10)
RDW: 12 % (ref 11.0–15.0)
Total Lymphocyte: 31.5 %
WBC: 7.1 10*3/uL (ref 3.8–10.8)

## 2022-11-18 NOTE — Telephone Encounter (Signed)
Last seen on 09/04/22 Follow up scheduled on 03/13/23

## 2022-11-19 ENCOUNTER — Encounter (INDEPENDENT_AMBULATORY_CARE_PROVIDER_SITE_OTHER): Payer: Self-pay

## 2022-11-19 LAB — IGG, IGA, IGM
IgG (Immunoglobin G), Serum: 698 mg/dL (ref 600–1640)
IgM, Serum: 91 mg/dL (ref 50–300)
Immunoglobulin A: 160 mg/dL (ref 47–310)

## 2022-12-12 ENCOUNTER — Telehealth: Payer: Self-pay | Admitting: Neurology

## 2022-12-12 ENCOUNTER — Ambulatory Visit (INDEPENDENT_AMBULATORY_CARE_PROVIDER_SITE_OTHER): Payer: 59 | Admitting: Neurology

## 2022-12-12 ENCOUNTER — Encounter: Payer: Self-pay | Admitting: Neurology

## 2022-12-12 VITALS — BP 121/78 | HR 82 | Ht 61.0 in | Wt 138.0 lb

## 2022-12-12 DIAGNOSIS — G35 Multiple sclerosis: Secondary | ICD-10-CM | POA: Diagnosis not present

## 2022-12-12 DIAGNOSIS — G40219 Localization-related (focal) (partial) symptomatic epilepsy and epileptic syndromes with complex partial seizures, intractable, without status epilepticus: Secondary | ICD-10-CM | POA: Diagnosis not present

## 2022-12-12 DIAGNOSIS — R5383 Other fatigue: Secondary | ICD-10-CM | POA: Diagnosis not present

## 2022-12-12 DIAGNOSIS — F988 Other specified behavioral and emotional disorders with onset usually occurring in childhood and adolescence: Secondary | ICD-10-CM

## 2022-12-12 DIAGNOSIS — H539 Unspecified visual disturbance: Secondary | ICD-10-CM

## 2022-12-12 DIAGNOSIS — F419 Anxiety disorder, unspecified: Secondary | ICD-10-CM

## 2022-12-12 DIAGNOSIS — R260 Ataxic gait: Secondary | ICD-10-CM

## 2022-12-12 DIAGNOSIS — F32A Depression, unspecified: Secondary | ICD-10-CM

## 2022-12-12 MED ORDER — AMPHETAMINE-DEXTROAMPHETAMINE 30 MG PO TABS: 30.0000 mg | ORAL_TABLET | Freq: Two times a day (BID) | ORAL | 0 refills | Status: DC

## 2022-12-12 MED ORDER — DULOXETINE HCL 60 MG PO CPEP: 60.0000 mg | ORAL_CAPSULE | Freq: Every day | ORAL | 11 refills | Status: DC

## 2022-12-12 NOTE — Telephone Encounter (Signed)
Referral for ophthalmology fax to [facility]. Phone: [phone], Fax: [phone]

## 2022-12-12 NOTE — Progress Notes (Signed)
Marland Kitchen   [facility]  PATIENT: Paula Anderson DOB: 07-07-1975  REFERRING DOCTOR OR PCP:  Delbert Harness    Fax [phone] ______________________________   HISTORICAL  CHIEF COMPLAINT:  Chief Complaint  Patient presents with   Room 10    Pt is here Alone.  Pt states that when she was driving she ws losing minutes. Pt states that lately she has walked off and left her gas on your stove and have forgotten about it. Pt states that her right leg keeps dropping. Pt states that she is tripping over things because of her right leg and foot. Pt states that she is unable to write with her right hand. Pt states that she is dropping things as well.    HISTORY OF PRESENT ILLNESS:  Paula Anderson is a 47 y.o. woman with relapsing remitting multiple sclerosis and seizures.    Update 12/12/2022: She is on Ocrevus and next infusion will be in December ocrevus due to the JCV Ab converting to positive.  Her next infusion will be in August.   She has tolerated the Ocrevus well.   She does home infusion   She had a hospitalization in November or December 2023 for pneumonia and notes she has little memory of this. She also has tobacco history and COPD diagnosis    She has since done the Pneumovax.     Gait is unsteady.  She stumbles a lot but no fall.     She hits the wall  and furniture.  Sometimes feels she is pushed to the left.     She feels she is pushed some to the left while walking and is more likely to fall that way.   Some hand weakness and clumsiness.  She drops items out of her right hand a lot.    Strength is about the same in legs.  She has some right arm numbness.    Bladder function is about the same.    S  She has a lot of fatigue daily, especially in the afternoons, despite Adderall 30 mg po twice a day (6 am and 1 pm --- by 5 pm she feels exhausted again)    She goes to bed at 7 pm if she does not take the Adderall or 12-1 am if she does.    For a while, she had difficulty getting  Adderall.  She needs a refill.  Donepezil had not helped the cognitive issues and she stopped.    She notes depression  is doing worse..  She has anxiety.   Pristiq and then Viibryd did not help more than other medication.   Also on lamotrigine 600 mg/day for seizures which may help with mood.    She takes valium for anxiety.      She has no new seizures.  (Most have been staring spells).  She is on Keppra 1000 mg twice a day and lamotrigine 300 mg twice daily.   On this dose she has not had definite breakthrough but has had a few episodes of phasing out - unclear if epileptiform.    She has not had any more episodes of vertigo.    She has insomnia but is also sleepy - often in bed by 8 pm. .   She takes temazepam only some nights    MS History:   She presented with difficulties with speech, memory and gait balance in 2013. MRIs were consistent with MS.   In 2014, she had a severe episode of  vertigo and was taken to the hospital. Repeat MRI imaging was performed and she was diagnosed officially with MS. She was started on Copaxone.   She received IV steroids couple times when she had severe fatigue.     She tolerates Copaxone well.    No difficulty with skin reactions.  I personally reviewed MRI images of the brain and spine.   In the posterior fossa there is a right middle cerebellar peduncle lesion, possible medulla focus and left midbrain focus and two small cerebellar hemisphere foci. She has thalamic foci.  There are multiple periventricular, deep and juxtacortical white matter foci in the hemispheres. Some of these are oriented to the ventricles. Some are hypointense on T1-weighted images. None enhance.  C- Spine 10/19/2014 reported without plaques.  Repeat Brain MRI 02/03/2016 is unchanged.   Repeat Cervical spine MRI 02/03/2016 shows small no-nenhancing focus adjacent to C7T1 in the left posterolateral spinal cord.   In 2018, she started Vumerity due to breakthrough activity on Copaxone as part of a  drug study but felt she did worse so switched to [place] and 2019.  She switched to [facility] May 2021 because travel to the office was difficult on a monthly basis.  IMAGING MRi brain 05/07/2017 showed multiple T2/FLAIR hyperintense foci in the brainstem, cerebellum and cerebral hemispheres in a pattern and configuration consistent with chronic demyelinating plaque associated with multiple sclerosis. None of the foci appears to be acute. When compared to the MRI dated 03/10/2017, there is no interval change.   MRI of the brain 09/24/2017 shows multiple T2/FLAIR hypertense foci in the cerebellum, brainstem and cerebral hemispheres in a pattern and configuration consistent with chronic demyelinating plaque associated with multiple sclerosis.  None of the foci appears to be acute.  When compared to the MRI dated 05/10/2017, there is no interval change.     There is a normal enhancement pattern and there are no acute findings  MRI of the brain 12/12/2017 was unchanged.  MRI of the brain 08/18/2018 was unchanged.  MRI of the brain 04/25/2020 showed no new lesions.  MRI of the cervical spine 04/25/2020 showed 2 subtle foci, 1 to the left adjacent to C4 and 1 to the left adjacent to C7-T1.  These had been seen on previous MRIs from 2017.  No significant degenerative change.  MRI of the brain 11/06/2021 showed no new lesions  REVIEW OF SYSTEMS: Constitutional: No fevers, chills, sweats, or change in appetite.  She has fatigue and insomnia Eyes: as above Ear, nose and throat: No hearing loss, ear pain, nasal congestion, sore throat.   She has vertigo. Cardiovascular: No chest pain, palpitations Respiratory:  No shortness of breath at rest or with exertion.   No wheezes GastrointestinaI: No nausea, vomiting, diarrhea, abdominal pain, fecal incontinence Genitourinal:   She has had multiple UTIs.  Musculoskeletal:  No neck pain.  She  notesback pain Integumentary: No rash, pruritus, skin lesions Neurological: as  above Psychiatric: Notes mild depression at this time.  No anxiety Endocrine: No palpitations, diaphoresis, change in appetite, change in weigh or increased thirst Hematologic/Lymphatic:  No anemia, purpura, petechiae.   She feels that she bruises easily. Allergic/Immunologic: No itchy/runny eyes, nasal congestion, recent allergic reactions, rashes  ALLERGIES: Allergies  Allergen Reactions   Amoxicillin Shortness Of Breath   Penicillins Shortness Of Breath    Skin turns red   Bupropion Other (See Comments)    Mood changes    HOME MEDICATIONS:  Current Outpatient Medications:    amphetamine-dextroamphetamine (  ADDERALL) 30 MG tablet, Take 1 tablet by mouth 2 (two) times daily., Disp: 60 tablet, Rfl: 0   diazepam (VALIUM) 5 MG tablet, TAKE ONE TABLET BY MOUTH ONE TIME DAILY AS NEEDED FOR SEVERE ANXIETY OR TRAVEL, Disp: 12 tablet, Rfl: 0   lamoTRIgine (LAMICTAL) 100 MG tablet, TAKE THREE TABLETS BY MOUTH TWICE A DAY, Disp: 180 tablet, Rfl: 3   levETIRAcetam (KEPPRA) 1000 MG tablet, Take one po qAM and 1 1/2 po qHS, Disp: 225 tablet, Rfl: 4   meclizine (ANTIVERT) 25 MG tablet, Take up to 3 tablets per day as needed for vertigo., Disp: 30 tablet, Rfl: 0   ocrelizumab (OCREVUS) 300 MG/10ML injection, Inject into the vein once., Disp: , Rfl:    ondansetron (ZOFRAN) 8 MG tablet, TAKE ONE TABLET BY MOUTH THREE TIMES A DAY AS NEEDED FOR NAUSEA AND VOMITING, Disp: 20 tablet, Rfl: 3   amphetamine-dextroamphetamine (ADDERALL) 30 MG tablet, Take 1 tablet by mouth 2 (two) times daily., Disp: 60 tablet, Rfl: 0   etodolac (LODINE) 400 MG tablet, Take 1 tablet (400 mg total) by mouth 2 (two) times daily. (Patient not taking: Reported on 12/12/2022), Disp: 60 tablet, Rfl: 3  PAST MEDICAL HISTORY: Past Medical History:  Diagnosis Date   Chronic headaches    Multiple sclerosis (HCC)    Seizures (HCC)     PAST SURGICAL HISTORY: Past Surgical History:  Procedure Laterality Date   ABDOMINAL SURGERY      COSMETIC SURGERY      FAMILY HISTORY: Family History  Problem Relation Age of Onset   Cancer Father    Heart failure Paternal Grandmother     SOCIAL HISTORY:  Social History   Socioeconomic History   Marital status: Married    Spouse name: Mirjana Tarleton   Number of children: 2   Years of education: 12   Highest education level: Not on file  Occupational History   Occupation: unemployeed  Tobacco Use   Smoking status: Former   Smokeless tobacco: Never   Tobacco comments:    quit 2010  Vaping Use   Vaping status: Never Used  Substance and Sexual Activity   Alcohol use: No    Alcohol/week: 0.0 standard drinks of alcohol   Drug use: No   Sexual activity: Yes    Partners: Male  Other Topics Concern   Not on file  Social History Narrative   Lives at home with husband and children   Drinks caffeine: 2 cups a day   Social Determinants of Health   Financial Resource Strain: Low Risk  (10/15/2021)   Received from [facility], [facility]   Overall Financial Resource Strain (CARDIA)    Difficulty of Paying Living Expenses: Not hard at all  Food Insecurity: Low Risk  (01/19/2022)   [facility]   Hunger Vital Sign    Worried About Running Out of Food in the Last Year: Never true    Within the past 12 months, the food you bought just didn't last and you didn't have money to get more: Not on file  Transportation Needs: No Transportation Needs (01/19/2022)   [facility]   Transportation    In the past 12 months, has lack of reliable transportation kept you from medical appointments, meetings, work or from getting things needed for daily living? : No  Physical Activity: Inactive (04/11/2021)   [facility]   Exercise Vital Sign    Days of Exercise  per Week: 1 day    Minutes of Exercise per Session: 0 min  Stress: Stress Concern Present (04/11/2021)   Received from [facility], [facility] of Occupational Health - Occupational Stress Questionnaire    Feeling of Stress : Very much  Social Connections: Unknown (06/26/2021)   Received from [facility], [facility]   Social Network    Social Network: Not on file  Recent Concern: Social Connections - Moderately Isolated (04/11/2021)   Received from [facility], [facility]   Social Connection and Isolation Panel [NHANES]    Frequency of Communication with Friends and Family: Three times a week    Frequency of Social Gatherings with Friends and Family: Never    Attends Religious Services: Never    Database administrator or Organizations: No    Attends Banker Meetings: Never    Marital Status: Married  Catering manager Violence: Low Risk  (01/19/2022)   [facility] from [facility] visits prior to 04/20/2022., [facility] visits prior to 04/20/2022.   Safety    How often does anyone, including family and friends, physically hurt you?: Never    How often does anyone, including family and friends, insult or talk down to you?: Never    How often does anyone, including family and friends, threaten you with harm?: Never    How often does anyone, including family and friends, scream or curse at you?: Never     PHYSICAL EXAM  Vitals:   12/12/22 0922  BP: 121/78  Pulse: 82  Weight: 138 lb (62.6 kg)  Height: 5\' 1"  (1.549 m)    Body mass index is 26.07 kg/m.   General: The patient is well-developed and well-nourished and in no acute distress         Neurologic Exam  Mental status: The patient is alert and oriented x 3 at the time of the examination. The patient has apparent normal recent and remote memory, with reduced attention span and concentration ability.   Speech is normal.  Cranial nerves: She has nystagmus on lateral gaze...  Facial symmetry is present. Facial strength and sensation is normal. Trapezius strength is normal..   No  obvious hearing deficits are noted.  Motor:  Muscle bulk is normal.  Muscle tone is mildly increased in the legs.. Strength is 5/5 in the arms and left leg and 4+/5 in the right foot.  Sensory: She has reduced sensation to touch on the right side and reduced vibration sensation on the left left..     Coordination:   Finger-nose-finger and heel-to-shin is reduced,  worse on the right..  Rapid altering movements were performed symmetrically in the hands  Gait and station: Station is normal.  Gait is wide. Cannot tandem.  She has a mild right foot drop.  Romberg is negative.   Reflexes: Deep tendon reflexes are symmetric and increased with spread at the knees bilaterally   ASSESSMENT AND PLAN  1. Multiple sclerosis (HCC)   2. Vision disturbance   3. Other fatigue   4. Partial symptomatic epilepsy with complex partial seizures, intractable, without status epilepticus (HCC)   5. Attention deficit disorder (ADD) in adult   6. Ataxic gait   7. Anxiety and depression       1.   Continue Ocrevus.  IgG and IgM were fine.  Check MRI of the brain around time of next visit (2025).  To see if there is any  subclinical progression and consider a different disease modifying therapy if this is happening. 2.  Continue medications for ADD.   Refill Adderall.    We discussed her mood issues.  She prefers not to see psychiatry agai.  I will add duloxetine. 3.   Due to the combination of her physical and cognitive impairments as well as her fatigue, she is disabled.  She would not be expected to improve to a point that she could return to work.   4.   She will return to see in 6 months or sooner if new or worsening neurologic symptoms.  40-minute office visit with the majority of the time spent face-to-face for history and physical, discussion/counseling and decision-making.  Additional time with record review and documentation.    Tayen Narang A. Epimenio Foot, MD, PhD, [place] 12/12/2022, 9:58 AM Certified in  Neurology, Clinical Neurophysiology, Sleep Medicine, Pain Medicine and Neuroimaging  [facility] [address] [place], [place] [address] [phone]

## 2022-12-19 NOTE — Telephone Encounter (Signed)
[facility] fax a response to referral  for ophthalmology: Clinical technician reviewed referral. Patient would need a referral to Neuro-ophthalmology.  Where would you like to send the referral?

## 2023-01-02 NOTE — Telephone Encounter (Signed)
Refaxing referral to [facility]. Phone: [phone], Fax: [phone]

## 2023-01-12 ENCOUNTER — Other Ambulatory Visit: Payer: Self-pay | Admitting: Neurology

## 2023-01-13 MED ORDER — AMPHETAMINE-DEXTROAMPHETAMINE 30 MG PO TABS: 30.0000 mg | ORAL_TABLET | Freq: Two times a day (BID) | ORAL | 0 refills | Status: DC

## 2023-01-13 NOTE — Telephone Encounter (Signed)
Last seen on 12/12/22 Follow up scheduled on 03/13/23 Last filled on 12/12/22 #60 tablets (30 day supply) Rx pending to be signed

## 2023-02-05 DIAGNOSIS — Z0271 Encounter for disability determination: Secondary | ICD-10-CM

## 2023-02-14 ENCOUNTER — Other Ambulatory Visit: Payer: Self-pay | Admitting: Neurology

## 2023-02-14 MED ORDER — AMPHETAMINE-DEXTROAMPHETAMINE 30 MG PO TABS: 30.0000 mg | ORAL_TABLET | Freq: Two times a day (BID) | ORAL | 0 refills | Status: DC

## 2023-03-03 ENCOUNTER — Encounter (INDEPENDENT_AMBULATORY_CARE_PROVIDER_SITE_OTHER): Payer: Self-pay

## 2023-03-06 ENCOUNTER — Telehealth: Payer: Self-pay | Admitting: Neurology

## 2023-03-06 ENCOUNTER — Encounter: Payer: Self-pay | Admitting: Neurology

## 2023-03-06 NOTE — Telephone Encounter (Signed)
Returned call to pt and stated if stable can push out to April. 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 Epimenio Foot called it optic neuritis but has progressively worsened in past 2 months with difficulty, reading, or screen time, seeing overall has just worsened. I didn't see anything in next 7 days for Dr. Epimenio Foot or Christia Reading np. I will route to them both and see if they will be able to work her in sooner she says will need to be a day husband is off but schedule is rotating. She may need several options.

## 2023-03-06 NOTE — Telephone Encounter (Signed)
Do you all think SS would be ok if it was converted to a [facility] visit for this? Ill send to her as well to review.

## 2023-03-06 NOTE — Telephone Encounter (Signed)
 Error

## 2023-03-06 NOTE — Telephone Encounter (Signed)
Dr. Epimenio Foot stated:  Epimenio Foot, Pearletha Furl, MD  You19 minutes ago (4:59 PM)    I can try to get her worked in January 23 at 9:00   However that was her originally scheduled date. Routing back to him and sarah slack to see if they have any other recommendation

## 2023-03-06 NOTE — Telephone Encounter (Signed)
Pt called to reschedule appointment  due to husband will not off work to bring to appointment. Pt asking if can change appointment on 03/13/23 to a MyChart Video Visit. Having issues with eyes and have gotten worse. Appointment scheduled is with Maralyn Sago on 03/13/23. Would like a call back

## 2023-03-10 ENCOUNTER — Other Ambulatory Visit: Payer: Self-pay | Admitting: Neurology

## 2023-03-10 NOTE — Telephone Encounter (Signed)
Routing to pod 1:   Sater, Pearletha Furl, MD  You7 minutes ago (8:43 AM)    If there are any openings we can get her worked in.  Sometimes I need to use one of the new patient slots (if a 1 hour slot can be broken up into 2 half-hour slots)

## 2023-03-10 NOTE — Telephone Encounter (Addendum)
LVM asking for pt to call office.  Per Dr. Epimenio Foot, ok to offer VV. Was going to offer 03/13/23 at 930a

## 2023-03-10 NOTE — Telephone Encounter (Signed)
Sarah slack stated the following:   It sounds like she wants to see Dr. Epimenio Foot.  I would check with Dr. Epimenio Foot to see if he is okay with a virtual visit as she requested. Thanks  Routing to pod Dr. Epimenio Foot to see if we can get her in with him sooner than later

## 2023-03-11 MED ORDER — AMPHETAMINE-DEXTROAMPHETAMINE 30 MG PO TABS: 30.0000 mg | ORAL_TABLET | Freq: Two times a day (BID) | ORAL | 0 refills | Status: DC

## 2023-03-11 NOTE — Telephone Encounter (Signed)
  Last appointment: 12/12/2022 Next appointment: 03/13/2023

## 2023-03-11 NOTE — Telephone Encounter (Addendum)
LVM for pt to call office.   Called husband at [phone]. He accepted appt 03/13/23 at 930am for mychart VV. They will call back if this does not work. She was sleeping at time of call.

## 2023-03-13 ENCOUNTER — Ambulatory Visit: Payer: 59 | Admitting: Neurology

## 2023-03-13 ENCOUNTER — Encounter: Payer: Self-pay | Admitting: Neurology

## 2023-03-13 ENCOUNTER — Telehealth (INDEPENDENT_AMBULATORY_CARE_PROVIDER_SITE_OTHER): Payer: 59 | Admitting: Neurology

## 2023-03-13 DIAGNOSIS — G35 Multiple sclerosis: Secondary | ICD-10-CM | POA: Diagnosis not present

## 2023-03-13 DIAGNOSIS — F32A Depression, unspecified: Secondary | ICD-10-CM

## 2023-03-13 DIAGNOSIS — H539 Unspecified visual disturbance: Secondary | ICD-10-CM | POA: Diagnosis not present

## 2023-03-13 DIAGNOSIS — G40219 Localization-related (focal) (partial) symptomatic epilepsy and epileptic syndromes with complex partial seizures, intractable, without status epilepticus: Secondary | ICD-10-CM | POA: Diagnosis not present

## 2023-03-13 DIAGNOSIS — F419 Anxiety disorder, unspecified: Secondary | ICD-10-CM

## 2023-03-13 DIAGNOSIS — R260 Ataxic gait: Secondary | ICD-10-CM

## 2023-03-13 DIAGNOSIS — R5383 Other fatigue: Secondary | ICD-10-CM | POA: Diagnosis not present

## 2023-03-13 DIAGNOSIS — F988 Other specified behavioral and emotional disorders with onset usually occurring in childhood and adolescence: Secondary | ICD-10-CM

## 2023-03-13 DIAGNOSIS — R350 Frequency of micturition: Secondary | ICD-10-CM

## 2023-03-13 DIAGNOSIS — Z79899 Other long term (current) drug therapy: Secondary | ICD-10-CM

## 2023-03-13 MED ORDER — DULOXETINE HCL 60 MG PO CPEP: 60.0000 mg | ORAL_CAPSULE | Freq: Every day | ORAL | 3 refills | Status: AC

## 2023-03-13 MED ORDER — PREDNISONE 50 MG PO TABS: ORAL_TABLET | ORAL | 0 refills | Status: AC

## 2023-03-13 MED ORDER — LEVETIRACETAM 1000 MG PO TABS: ORAL_TABLET | ORAL | 4 refills | Status: AC

## 2023-03-13 NOTE — Progress Notes (Signed)
Marland Kitchen   [facility]  PATIENT: Paula Anderson DOB: 1975-03-16  REFERRING DOCTOR OR PCP:  Delbert Harness    Fax [phone] ______________________________   HISTORICAL  CHIEF COMPLAINT:  Chief Complaint  Patient presents with   Multiple Sclerosis     HISTORY OF PRESENT ILLNESS:  Paula Anderson is a 48 y.o. woman with relapsing remitting multiple sclerosis and seizures.    Virtual Visit via Video Note I connected with Whitney Muse on 03/13/23 at  9:30 AM EST by a video enabled telemedicine application and verified that I am speaking with the correct person.  I discussed the limitations of evaluation and management by telemedicine and the availability of in person appointments. The patient expressed understanding and agreed to proceed.  Patient was at home and the provider was in the office.  Update 03/13/2023: Vision is worse, now bilateral, worse on left.   Se has had a little eye pain in the mornings not definitely associated with movements.    Peripheral vision and to the left is worse..   She has trouble seeing the TV and can't read.    She does not feel safe driving  She is on Ocrevus and next infusion will be in early February.  She felt better on Tysabri but converted to JCV antibody positive.    She has tolerated the Ocrevus well.   She does home infusion   She had a hospitalization in November or December 2023 for pneumonia and notes she has little memory of this. She also has tobacco history and COPD diagnosis    She has since done the Pneumovax.  She also had some urinary tract infections but none recently.  No recent infection .     Gait is unsteady.  She stumbles a lot but no fall.     She hits the wall  and furniture.  Sometimes feels she is pushed to the left.     She feels she is pushed some to the left while walking and is more likely to fall that way.   Some hand weakness and clumsiness.  She drops items out of her right hand a lot.    Strength is about the  same in legs.  She has some right arm numbness.    She has recently urgency  She continues to note a lot of fatigue.  She takes Adderall 30 mg twice a day which helps some but incompletely   Donepezil had not helped the cognitive issues and she stopped.    She notes depression  is better with duloxetine..  She has anxiety.      She takes valium for anxiety.      She has no new seizures.  (Most have been staring spells).  She is on Keppra 1000 mg twice a day and lamotrigine 300 mg twice daily.   On this dose she has not had definite breakthrough but has had a few episodes of phasing out - unclear if epileptiform.    She has insomnia but is also sleepy - often in bed by 8 pm. .  PE:   She is a well-developed well-nourished woman in no acute distress.  The head is normocephalic and atraumatic.  Sclera are anicteric.  Visible skin appears normal.  The neck has a good range of motion.    She is alert and fully oriented with fluent speech and good attention, knowledge and memory.  Extraocular muscles are intact.  Facial strength is normal.  Palatal elevation and tongue  protrusion are midline.  She appears to have normal strength in the arms.  Rapid alternating movements and finger-nose-finger are performed well.   MS History:   She presented with difficulties with speech, memory and gait balance in 2013. MRIs were consistent with MS.   In 2014, she had a severe episode of vertigo and was taken to the hospital. Repeat MRI imaging was performed and she was diagnosed officially with MS. She was started on Copaxone.   She received IV steroids couple times when she had severe fatigue.     She tolerates Copaxone well.    No difficulty with skin reactions.  I personally reviewed MRI images of the brain and spine.   In the posterior fossa there is a right middle cerebellar peduncle lesion, possible medulla focus and left midbrain focus and two small cerebellar hemisphere foci. She has thalamic foci.  There are  multiple periventricular, deep and juxtacortical white matter foci in the hemispheres. Some of these are oriented to the ventricles. Some are hypointense on T1-weighted images. None enhance.  C- Spine 10/19/2014 reported without plaques.  Repeat Brain MRI 02/03/2016 is unchanged.   Repeat Cervical spine MRI 02/03/2016 shows small no-nenhancing focus adjacent to C7T1 in the left posterolateral spinal cord.   In 2018, she started Vumerity due to breakthrough activity on Copaxone as part of a drug study but felt she did worse so switched to [place] and 2019.  She switched to [facility] May 2021 because travel to the office was difficult on a monthly basis.  IMAGING MRi brain 05/07/2017 showed multiple T2/FLAIR hyperintense foci in the brainstem, cerebellum and cerebral hemispheres in a pattern and configuration consistent with chronic demyelinating plaque associated with multiple sclerosis. None of the foci appears to be acute. When compared to the MRI dated 03/10/2017, there is no interval change.   MRI of the brain 09/24/2017 shows multiple T2/FLAIR hypertense foci in the cerebellum, brainstem and cerebral hemispheres in a pattern and configuration consistent with chronic demyelinating plaque associated with multiple sclerosis.  None of the foci appears to be acute.  When compared to the MRI dated 05/10/2017, there is no interval change.     There is a normal enhancement pattern and there are no acute findings  MRI of the brain 12/12/2017 was unchanged.  MRI of the brain 08/18/2018 was unchanged.  MRI of the brain 04/25/2020 showed no new lesions.  MRI of the cervical spine 04/25/2020 showed 2 subtle foci, 1 to the left adjacent to C4 and 1 to the left adjacent to C7-T1.  These had been seen on previous MRIs from 2017.  No significant degenerative change.  MRI of the brain 11/06/2021 showed no new lesions  REVIEW OF SYSTEMS: Constitutional: No fevers, chills, sweats, or change in appetite.  She has fatigue and  insomnia Eyes: as above Ear, nose and throat: No hearing loss, ear pain, nasal congestion, sore throat.   She has vertigo. Cardiovascular: No chest pain, palpitations Respiratory:  No shortness of breath at rest or with exertion.   No wheezes GastrointestinaI: No nausea, vomiting, diarrhea, abdominal pain, fecal incontinence Genitourinal:   She has had multiple UTIs.  Musculoskeletal:  No neck pain.  She  notesback pain Integumentary: No rash, pruritus, skin lesions Neurological: as above Psychiatric: Notes mild depression at this time.  No anxiety Endocrine: No palpitations, diaphoresis, change in appetite, change in weigh or increased thirst Hematologic/Lymphatic:  No anemia, purpura, petechiae.   She feels that she bruises easily. Allergic/Immunologic: No itchy/runny  eyes, nasal congestion, recent allergic reactions, rashes  ALLERGIES: Allergies  Allergen Reactions   Amoxicillin Shortness Of Breath   Penicillins Shortness Of Breath    Skin turns red   Bupropion Other (See Comments)    Mood changes    HOME MEDICATIONS:  Current Outpatient Medications:    predniSONE (DELTASONE) 50 MG tablet, 20 tablets (1000 mg) po every day x 4 days.  Take with food and may split over the day, Disp: 80 tablet, Rfl: 0   amphetamine-dextroamphetamine (ADDERALL) 30 MG tablet, Take 1 tablet by mouth 2 (two) times daily., Disp: 60 tablet, Rfl: 0   diazepam (VALIUM) 5 MG tablet, TAKE ONE TABLET BY MOUTH ONE TIME DAILY AS NEEDED FOR SEVERE ANXIETY OR TRAVEL, Disp: 12 tablet, Rfl: 0   DULoxetine (CYMBALTA) 60 MG capsule, Take 1 capsule (60 mg total) by mouth daily., Disp: 90 capsule, Rfl: 3   etodolac (LODINE) 400 MG tablet, Take 1 tablet (400 mg total) by mouth 2 (two) times daily. (Patient not taking: Reported on 12/12/2022), Disp: 60 tablet, Rfl: 3   lamoTRIgine (LAMICTAL) 100 MG tablet, TAKE THREE TABLETS BY MOUTH TWICE A DAY, Disp: 180 tablet, Rfl: 3   levETIRAcetam (KEPPRA) 1000 MG tablet, Take one  po qAM and 1 1/2 po qHS, Disp: 225 tablet, Rfl: 4   meclizine (ANTIVERT) 25 MG tablet, Take up to 3 tablets per day as needed for vertigo., Disp: 30 tablet, Rfl: 0   ocrelizumab (OCREVUS) 300 MG/10ML injection, Inject into the vein once., Disp: , Rfl:    ondansetron (ZOFRAN) 8 MG tablet, TAKE ONE TABLET BY MOUTH THREE TIMES A DAY AS NEEDED FOR NAUSEA AND VOMITING, Disp: 20 tablet, Rfl: 3  PAST MEDICAL HISTORY: Past Medical History:  Diagnosis Date   Chronic headaches    Multiple sclerosis (HCC)    Seizures (HCC)     PAST SURGICAL HISTORY: Past Surgical History:  Procedure Laterality Date   ABDOMINAL SURGERY     COSMETIC SURGERY      FAMILY HISTORY: Family History  Problem Relation Age of Onset   Cancer Father    Heart failure Paternal Grandmother     SOCIAL HISTORY:  Social History   Socioeconomic History   Marital status: Married    Spouse name: Nisreen Guise   Number of children: 2   Years of education: 12   Highest education level: Not on file  Occupational History   Occupation: unemployeed  Tobacco Use   Smoking status: Former   Smokeless tobacco: Never   Tobacco comments:    quit 2010  Vaping Use   Vaping status: Never Used  Substance and Sexual Activity   Alcohol use: No    Alcohol/week: 0.0 standard drinks of alcohol   Drug use: No   Sexual activity: Yes    Partners: Male  Other Topics Concern   Not on file  Social History Narrative   Lives at home with husband and children   Drinks caffeine: 2 cups a day   Social Drivers of Health   Financial Resource Strain: Low Risk  (10/15/2021)   [facility]   Overall Financial Resource Strain (CARDIA)    Difficulty of Paying Living Expenses: Not hard at all  Food Insecurity: Low Risk  (01/19/2022)   [facility]   Hunger Vital Sign    Worried About Running Out of Food in the Last Year: Never true    Within the past 12 months, the food you  bought just  didn't last and you didn't have money to get more: Not on file  Transportation Needs: No Transportation Needs (01/19/2022)   [facility]   Transportation    In the past 12 months, has lack of reliable transportation kept you from medical appointments, meetings, work or from getting things needed for daily living? : No  Physical Activity: Inactive (04/11/2021)   [facility]   Exercise Vital Sign    Days of Exercise per Week: 1 day    Minutes of Exercise per Session: 0 min  Stress: Stress Concern Present (04/11/2021)   Received from [facility], [facility] of Occupational Health - Occupational Stress Questionnaire    Feeling of Stress : Very much  Social Connections: Unknown (06/26/2021)   Received from [facility], [facility]   Social Network    Social Network: Not on file  Recent Concern: Social Connections - Moderately Isolated (04/11/2021)   [facility], [facility]   Social Connection and Isolation Panel [NHANES]    Frequency of Communication with Friends and Family: Three times a week    Frequency of Social Gatherings with Friends and Family: Never    Attends Religious Services: Never    Database administrator or Organizations: No    Attends Banker Meetings: Never    Marital Status: Married  Catering manager Violence: Low Risk  (01/19/2022)   [facility] from [facility] visits prior to 04/20/2022., [facility] visits prior to 04/20/2022.   Safety    How often does anyone, including family and friends, physically hurt you?: Never    How often does anyone, including family and friends, insult or talk down to you?: Never    How often does anyone, including family and friends, threaten you with harm?: Never    How often does anyone, including family and friends, scream or curse at you?: Never     PHYSICAL EXAM  There were no  vitals filed for this visit.   There is no height or weight on file to calculate BMI.   General: The patient is well-developed and well-nourished and in no acute distress         Neurologic Exam  Mental status: The patient is alert and oriented x 3 at the time of the examination. The patient has apparent normal recent and remote memory, with reduced attention span and concentration ability.   Speech is normal.  Cranial nerves: She has nystagmus on lateral gaze...  Facial symmetry is present. Facial strength and sensation is normal. Trapezius strength is normal..   No obvious hearing deficits are noted.  Motor:  Muscle bulk is normal.  Muscle tone is mildly increased in the legs.. Strength is 5/5 in the arms and left leg and 4+/5 in the right foot.  Sensory: She has reduced sensation to touch on the right side and reduced vibration sensation on the left left..     Coordination:   Finger-nose-finger and heel-to-shin is reduced,  worse on the right..  Rapid altering movements were performed a little better on the left than the right.  Gait and station: Station is normal.  Gait is mildly wide.  Tandem gait is very poor and severe left. She does not need support.  She has a mild right foot drop.  Romberg is negative.   Reflexes: Deep tendon reflexes are symmetric and increased (spread at knees)  bilaterally.     ASSESSMENT AND PLAN  1. Multiple sclerosis (HCC)   2. Vision changes   3. Vision disturbance   4. Other fatigue   5. Partial symptomatic epilepsy with complex partial seizures, intractable, without status epilepticus (HCC)   6. Attention deficit disorder (ADD) in adult   7. Ataxic gait   8. Anxiety and depression   9. High risk medication use   10. Increased frequency of urination      1.  Continue Ocrevus.   IgG was fine when last tested.  Due to the worsening symptoms we need to check an MRI of the brain to determine if there is breakthrough activity and consider a  different disease modifying therapy if this is occurring. 2.  Continue medications for ADD and depression.   3.   Due to the combination of her physical and cognitive impairments as well as her fatigue, she is disabled.  She would not be expected to improve to a point that she could return to work.   4.  She was unable to do an appointment with a neuro-ophthalmologist due to the travel.  I have advised her to try to get in with an ophthalmologist closer to home to make sure that there is not a nylon MS explanation for her visual symptoms.   She will return to see in 6 months or sooner if new or worsening neurologic symptoms.     Follow Up Instructions: I discussed the assessment and treatment plan with the patient. The patient was provided an opportunity to ask questions and all were answered. The patient agreed with the plan and demonstrated an understanding of the instructions.    The patient was advised to call back or seek an in-person evaluation if the symptoms worsen or if the condition fails to improve as anticipated.  I provided 29 minutes of non-face-to-face time during this encounter.  Eligha Kmetz A. Epimenio Foot, MD, PhD, [place] 03/13/2023, 1:08 PM Certified in Neurology, Clinical Neurophysiology, Sleep Medicine, Pain Medicine and Neuroimaging  [facility] [address] [place], [place] [address] [phone]

## 2023-03-17 ENCOUNTER — Telehealth: Payer: Self-pay | Admitting: Neurology

## 2023-03-17 NOTE — Telephone Encounter (Signed)
[employer] Berkley Harvey: [id number] exp. 03/17/23-05/01/23  I spoke to the patient and we need to schedule after her next infusion, she will call me back to schedule once she knows that date.

## 2023-03-26 ENCOUNTER — Telehealth: Payer: Self-pay

## 2023-03-26 NOTE — Telephone Encounter (Signed)
 Faxed [facility] signed document at [phone]

## 2023-04-01 ENCOUNTER — Other Ambulatory Visit: Payer: 59

## 2023-04-06 ENCOUNTER — Encounter: Payer: Self-pay | Admitting: Neurology

## 2023-04-13 ENCOUNTER — Other Ambulatory Visit: Payer: Self-pay | Admitting: Neurology

## 2023-04-15 ENCOUNTER — Ambulatory Visit (INDEPENDENT_AMBULATORY_CARE_PROVIDER_SITE_OTHER): Payer: 59

## 2023-04-15 DIAGNOSIS — H539 Unspecified visual disturbance: Secondary | ICD-10-CM

## 2023-04-15 DIAGNOSIS — G35 Multiple sclerosis: Secondary | ICD-10-CM | POA: Diagnosis not present

## 2023-04-15 MED ORDER — GADOBENATE DIMEGLUMINE 529 MG/ML IV SOLN: 20.0000 mL | Freq: Once | INTRAVENOUS | Status: DC | PRN

## 2023-04-15 MED ORDER — GADOBENATE DIMEGLUMINE 529 MG/ML IV SOLN
12.0000 mL | Freq: Once | INTRAVENOUS | Status: AC | PRN
  Administered 2023-04-15: 12 mL via INTRAVENOUS

## 2023-04-15 MED ORDER — AMPHETAMINE-DEXTROAMPHETAMINE 30 MG PO TABS: 30.0000 mg | ORAL_TABLET | Freq: Two times a day (BID) | ORAL | 0 refills | Status: DC

## 2023-04-15 NOTE — Telephone Encounter (Signed)
 Pt last seen 03/13/23. Last refilled 03/14/23 #60.

## 2023-04-16 ENCOUNTER — Encounter: Payer: Self-pay | Admitting: Neurology

## 2023-04-18 ENCOUNTER — Encounter: Payer: Self-pay | Admitting: Neurology

## 2023-05-20 ENCOUNTER — Other Ambulatory Visit: Payer: Self-pay | Admitting: Neurology

## 2023-05-20 MED ORDER — AMPHETAMINE-DEXTROAMPHETAMINE 30 MG PO TABS: 30.0000 mg | ORAL_TABLET | Freq: Two times a day (BID) | ORAL | 0 refills | Status: DC

## 2023-05-20 NOTE — Telephone Encounter (Signed)
 Last seen on 03/13/23 No follow up scheduled  Last filled on 04/15/23 #60 tablets(30 day supply) Rx pending to be signed

## 2023-06-19 ENCOUNTER — Encounter: Payer: Self-pay | Admitting: Neurology

## 2023-06-19 ENCOUNTER — Other Ambulatory Visit: Payer: Self-pay | Admitting: Neurology

## 2023-06-20 ENCOUNTER — Other Ambulatory Visit: Payer: Self-pay | Admitting: Neurology

## 2023-06-20 MED ORDER — AMPHETAMINE-DEXTROAMPHETAMINE 30 MG PO TABS: 30.0000 mg | ORAL_TABLET | Freq: Two times a day (BID) | ORAL | 0 refills | Status: DC

## 2023-06-20 NOTE — Telephone Encounter (Signed)
 Last seen 03/13/23. Last refilled 05/20/23 #60.

## 2023-06-22 ENCOUNTER — Encounter: Payer: Self-pay | Admitting: Neurology

## 2023-06-23 NOTE — Telephone Encounter (Signed)
 Last seen on 03/13/23 Follow up scheduled on 09/15/23

## 2023-06-26 ENCOUNTER — Encounter: Payer: Self-pay | Admitting: Neurology

## 2023-07-20 ENCOUNTER — Other Ambulatory Visit: Payer: Self-pay | Admitting: Neurology

## 2023-07-20 ENCOUNTER — Encounter: Payer: Self-pay | Admitting: Neurology

## 2023-07-21 ENCOUNTER — Other Ambulatory Visit: Payer: Self-pay

## 2023-07-21 MED ORDER — AMPHETAMINE-DEXTROAMPHETAMINE 30 MG PO TABS: 30.0000 mg | ORAL_TABLET | Freq: Two times a day (BID) | ORAL | 0 refills | Status: DC

## 2023-07-21 NOTE — Telephone Encounter (Signed)
 Pt Last Seen 03/13/2023 Upcoming Appointment 09/15/2023  Adderall Last Filled 06/20/2023 Escript 07/21/2023

## 2023-07-22 NOTE — Telephone Encounter (Signed)
 Last seen on 03/13/23 Follow up scheduled on 09/15/23

## 2023-08-06 ENCOUNTER — Encounter: Payer: Self-pay | Admitting: Neurology

## 2023-08-11 ENCOUNTER — Encounter: Payer: Self-pay | Admitting: Neurology

## 2023-08-11 ENCOUNTER — Other Ambulatory Visit: Payer: Self-pay | Admitting: *Deleted

## 2023-08-11 MED ORDER — AMPHETAMINE-DEXTROAMPHETAMINE 30 MG PO TABS: 30.0000 mg | ORAL_TABLET | Freq: Two times a day (BID) | ORAL | 0 refills | Status: DC

## 2023-08-11 NOTE — Telephone Encounter (Signed)
 Pt last refilled adderall 07/21/23 #60.

## 2023-08-11 NOTE — Telephone Encounter (Signed)
 You approved sending in refill of adderall now for pt.

## 2023-09-09 ENCOUNTER — Telehealth: Payer: Self-pay | Admitting: *Deleted

## 2023-09-09 NOTE — Telephone Encounter (Signed)
 MD approved faxed signed order below back to [facility]. Received fax confirmation.  Last labs 10/2022 looked ok. Has f/u 09/15/23. Next infusion due 09/2023.

## 2023-09-10 ENCOUNTER — Telehealth: Payer: Self-pay

## 2023-09-10 NOTE — Telephone Encounter (Signed)
 Received a faxed Order Form for this PT via Bulk Mail in fax-I have faxed this back to [facility] attention POD 1.

## 2023-09-10 NOTE — Telephone Encounter (Signed)
 Thank you, it was sent to you in error. We do have this, please disregard.

## 2023-09-15 ENCOUNTER — Encounter: Payer: Self-pay | Admitting: Neurology

## 2023-09-15 ENCOUNTER — Ambulatory Visit (INDEPENDENT_AMBULATORY_CARE_PROVIDER_SITE_OTHER): Admitting: Neurology

## 2023-09-15 VITALS — BP 108/70 | HR 83 | Ht 62.0 in | Wt 144.0 lb

## 2023-09-15 DIAGNOSIS — G35 Multiple sclerosis: Secondary | ICD-10-CM | POA: Diagnosis not present

## 2023-09-15 DIAGNOSIS — Z79899 Other long term (current) drug therapy: Secondary | ICD-10-CM

## 2023-09-15 DIAGNOSIS — F988 Other specified behavioral and emotional disorders with onset usually occurring in childhood and adolescence: Secondary | ICD-10-CM

## 2023-09-15 DIAGNOSIS — F32A Depression, unspecified: Secondary | ICD-10-CM

## 2023-09-15 DIAGNOSIS — R260 Ataxic gait: Secondary | ICD-10-CM | POA: Diagnosis not present

## 2023-09-15 DIAGNOSIS — H539 Unspecified visual disturbance: Secondary | ICD-10-CM | POA: Diagnosis not present

## 2023-09-15 DIAGNOSIS — F419 Anxiety disorder, unspecified: Secondary | ICD-10-CM

## 2023-09-15 DIAGNOSIS — G40219 Localization-related (focal) (partial) symptomatic epilepsy and epileptic syndromes with complex partial seizures, intractable, without status epilepticus: Secondary | ICD-10-CM

## 2023-09-15 DIAGNOSIS — R5383 Other fatigue: Secondary | ICD-10-CM

## 2023-09-15 DIAGNOSIS — R7989 Other specified abnormal findings of blood chemistry: Secondary | ICD-10-CM

## 2023-09-15 MED ORDER — TAMSULOSIN HCL 0.4 MG PO CAPS: 0.4000 mg | ORAL_CAPSULE | Freq: Every day | ORAL | 11 refills | Status: AC

## 2023-09-15 NOTE — Progress Notes (Signed)
 [facility]  PATIENT: Paula Anderson DOB: [birth year]  REFERRING DOCTOR OR PCP:  Luke Manns    Fax [phone] ______________________________   HISTORICAL  CHIEF COMPLAINT:  Chief Complaint  Patient presents with   Follow-up    Pt in room 10. Alone.Here for MS follow up. DMT: Ocrevus. Pt reports her legs feels like they won't move at times, right foot drop, 1-2 falls recently.     HISTORY OF PRESENT ILLNESS:  Paula Anderson is a 48 y.o. woman with relapsing remitting multiple sclerosis and seizures.    Update 09/15/2023: She is on Ocrevus and next infusion will be in early August (last one was February)  She switched from Tysabri to Ocrevus due to the JCV Ab converting to positive.   She does home infusion   She had a hospitalization in November or December 2023 for pneumonia  She has since done the Pneumovax.   She had shingles earlier this year.    The shingles was on the right inside her ear.   She had pain and a rash but no weakness.  MRI of the brain 04/15/2023 showed no new lesions compared to 11/06/2021.  Gait is unsteady.  She stumbles and has had a couple falls.    She uses the wall  and furniture.      She feels she is pushed some to the left while walking and is more likely to fall that way.   She has mild leg weakness and hand weakness .  She drops items out of her right hand a lot.    Her legs tire out easily. Only mild right sided numbness.   Bladder function is about the same - she has a lot of hesitancy and double voids.     She has a lot of fatigue daily, especially in the afternoons, despite Adderall 30 mg po twice a day (6 am and 1 pm --- by 5 pm she feels exhausted again)    She goes to bed at 7 pm if she does not take the Adderall or 12-1 am if she does.    For a while, she had difficulty getting Adderall.  She needs a refill.  Donepezil  had not helped the cognitive issues and she stopped.    She notes depression  is doing worse..  She has  anxiety.   Pristiq  and then Viibryd  did not help more than other medication.     She takes valium  for anxiety.   She has no new staring spell or GTC seizures.    She is on Keppra  1000 mg twice a day and lamotrigine  300 mg twice daily.   On this dose she has not had obvious breakthrough but has had a few episodes of phasing out - unclear if epileptiform.    She has insomnia but is also sleepy - often in bed by 8 pm.    MS History:   She presented with difficulties with speech, memory and gait balance in 2013. MRIs were consistent with MS.   In 2014, she had a severe episode of vertigo and was taken to the hospital. Repeat MRI imaging was performed and she was diagnosed officially with MS. She was started on Copaxone .   She received IV steroids couple times when she had severe fatigue.     She tolerates Copaxone  well.    No difficulty with skin reactions.  I personally reviewed MRI images of the brain and spine.   In the posterior fossa there  is a right middle cerebellar peduncle lesion, possible medulla focus and left midbrain focus and two small cerebellar hemisphere foci. She has thalamic foci.  There are multiple periventricular, deep and juxtacortical white matter foci in the hemispheres. Some of these are oriented to the ventricles. Some are hypointense on T1-weighted images. None enhance.  C- Spine 10/19/2014 reported without plaques.  Repeat Brain MRI 02/03/2016 is unchanged.   Repeat Cervical spine MRI 02/03/2016 shows small no-nenhancing focus adjacent to C7T1 in the left posterolateral spinal cord.   In 2018, she started Vumerity due to breakthrough activity on Copaxone  as part of a drug study but felt she did worse so switched to Tysabri and 2019.  She switched to Ocrevus May 2021 because travel to the office was difficult on a monthly basis.  IMAGING MRi brain 05/07/2017 showed multiple T2/FLAIR hyperintense foci in the brainstem, cerebellum and cerebral hemispheres in a pattern and configuration  consistent with chronic demyelinating plaque associated with multiple sclerosis. None of the foci appears to be acute. When compared to the MRI dated 03/10/2017, there is no interval change.   MRI of the brain 09/24/2017 shows multiple T2/FLAIR hypertense foci in the cerebellum, brainstem and cerebral hemispheres in a pattern and configuration consistent with chronic demyelinating plaque associated with multiple sclerosis.  None of the foci appears to be acute.  When compared to the MRI dated 05/10/2017, there is no interval change.     There is a normal enhancement pattern and there are no acute findings  MRI of the brain 12/12/2017 was unchanged.  MRI of the brain 08/18/2018 was unchanged.  MRI of the brain 04/25/2020 showed no new lesions.  MRI of the cervical spine 04/25/2020 showed 2 subtle foci, 1 to the left adjacent to C4 and 1 to the left adjacent to C7-T1.  These had been seen on previous MRIs from 2017.  No significant degenerative change.  MRI of the brain 11/06/2021 showed no new lesions  MRI of the brain 04/15/2023 was unchanged compared to 11/06/2021.  REVIEW OF SYSTEMS: Constitutional: No fevers, chills, sweats, or change in appetite.  She has fatigue and insomnia Eyes: as above Ear, nose and throat: No hearing loss, ear pain, nasal congestion, sore throat.   She has vertigo. Cardiovascular: No chest pain, palpitations Respiratory:  No shortness of breath at rest or with exertion.   No wheezes GastrointestinaI: No nausea, vomiting, diarrhea, abdominal pain, fecal incontinence Genitourinal:   She has had multiple UTIs.  Musculoskeletal:  No neck pain.  She  notesback pain Integumentary: No rash, pruritus, skin lesions Neurological: as above Psychiatric: Notes mild depression at this time.  No anxiety Endocrine: No palpitations, diaphoresis, change in appetite, change in weigh or increased thirst Hematologic/Lymphatic:  No anemia, purpura, petechiae.   She feels that she bruises  easily. Allergic/Immunologic: No itchy/runny eyes, nasal congestion, recent allergic reactions, rashes  ALLERGIES: Allergies  Allergen Reactions   Amoxicillin Shortness Of Breath   Penicillins Shortness Of Breath    Skin turns red   Bupropion Other (See Comments)    Mood changes    HOME MEDICATIONS:  Current Outpatient Medications:    amphetamine -dextroamphetamine  (ADDERALL) 30 MG tablet, Take 1 tablet by mouth 2 (two) times daily., Disp: 60 tablet, Rfl: 0   diazepam  (VALIUM ) 5 MG tablet, TAKE ONE TABLET BY MOUTH ONE TIME DAILY AS NEEDED FOR SEVERE ANXIETY OR TRAVEL, Disp: 12 tablet, Rfl: 0   DULoxetine  (CYMBALTA ) 60 MG capsule, Take 1 capsule (60 mg total) by mouth  daily., Disp: 90 capsule, Rfl: 3   etodolac  (LODINE ) 400 MG tablet, Take 1 tablet (400 mg total) by mouth 2 (two) times daily., Disp: 60 tablet, Rfl: 3   lamoTRIgine  (LAMICTAL ) 100 MG tablet, TAKE THREE TABLETS BY MOUTH TWICE A DAY, Disp: 180 tablet, Rfl: 0   levETIRAcetam  (KEPPRA ) 1000 MG tablet, Take one po qAM and 1 1/2 po qHS, Disp: 225 tablet, Rfl: 4   meclizine  (ANTIVERT ) 25 MG tablet, Take up to 3 tablets per day as needed for vertigo., Disp: 30 tablet, Rfl: 0   ocrelizumab (OCREVUS) 300 MG/10ML injection, Inject into the vein once., Disp: , Rfl:    ondansetron  (ZOFRAN ) 8 MG tablet, TAKE ONE TABLET BY MOUTH THREE TIMES A DAY AS NEEDED FOR NAUSEA AND VOMITING, Disp: 20 tablet, Rfl: 3   predniSONE  (DELTASONE ) 50 MG tablet, 20 tablets (1000 mg) po every day x 4 days.  Take with food and may split over the day, Disp: 80 tablet, Rfl: 0   tamsulosin  (FLOMAX ) 0.4 MG CAPS capsule, Take 1 capsule (0.4 mg total) by mouth daily., Disp: 30 capsule, Rfl: 11  PAST MEDICAL HISTORY: Past Medical History:  Diagnosis Date   Chronic headaches    Multiple sclerosis (HCC)    Seizures (HCC)     PAST SURGICAL HISTORY: Past Surgical History:  Procedure Laterality Date   ABDOMINAL SURGERY     COSMETIC SURGERY      FAMILY  HISTORY: Family History  Problem Relation Age of Onset   Cancer Father    Heart failure Paternal Grandmother     SOCIAL HISTORY:  Social History   Socioeconomic History   Marital status: Married    Spouse name: Yonas Bunda   Number of children: 2   Years of education: 12   Highest education level: Not on file  Occupational History   Occupation: unemployeed  Tobacco Use   Smoking status: Former   Smokeless tobacco: Never   Tobacco comments:    quit 2010  Vaping Use   Vaping status: Every Day  Substance and Sexual Activity   Alcohol use: No    Alcohol/week: 0.0 standard drinks of alcohol   Drug use: No   Sexual activity: Yes    Partners: Male  Other Topics Concern   Not on file  Social History Narrative   Lives at home with husband and children   Drinks caffeine: 2 cups a day   Social Drivers of Health   Financial Resource Strain: Low Risk  (10/15/2021)   Received from [facility]   Overall Financial Resource Strain (CARDIA)    Difficulty of Paying Living Expenses: Not hard at all  Food Insecurity: Low Risk  (01/19/2022)   Received from [facility]   Hunger Vital Sign    Within the past 12 months, you worried that your food would run out before you got money to buy more: Never true    Within the past 12 months, the food you bought just didn't last and you didn't have money to get more: Not on file  Transportation Needs: No Transportation Needs (01/19/2022)   Received from [facility]    In the past 12 months, has lack of reliable transportation kept you from medical appointments, meetings, work or from getting things needed for daily living? : No  Physical Activity: Inactive (04/11/2021)   Received from [facility]   Exercise Vital Sign    On average, how many days per week do you engage in moderate to  strenuous exercise (like a brisk walk)?: 1 day    On average, how many minutes do you engage in exercise at this level?: 0 min  Stress: Stress  Concern Present (04/11/2021)   Received from [facility] - Occupational Stress Questionnaire    Feeling of Stress : Very much  Social Connections: Unknown (06/26/2021)   Received from [facility]   Social Network    Social Network: Not on file  Recent Concern: Social Connections - Moderately Isolated (04/11/2021)   Received from [facility]   Social Connection and Isolation Panel    In a typical week, how many times do you talk on the phone with family, friends, or neighbors?: Three times a week    How often do you get together with friends or relatives?: Never    How often do you attend church or religious services?: Never    Do you belong to any clubs or organizations such as church groups, unions, fraternal or athletic groups, or school groups?: No    How often do you attend meetings of the clubs or organizations you belong to?: Never    Are you married, widowed, divorced, separated, never married, or living with a partner?: Married  Intimate Partner Violence: Low Risk  (01/19/2022)   Received from [facility] visits prior to 04/20/2022.   Safety    How often does anyone, including family and friends, physically hurt you?: Never    How often does anyone, including family and friends, insult or talk down to you?: Never    How often does anyone, including family and friends, threaten you with harm?: Never    How often does anyone, including family and friends, scream or curse at you?: Never     PHYSICAL EXAM  Vitals:   09/15/23 1418  BP: 108/70  Anderson: 83  Weight: 144 lb (65.3 kg)  Height: 5' 2 (1.575 m)    Body mass index is 26.34 kg/m.   General: The patient is well-developed and well-nourished and in no acute distress         Neurologic Exam  Mental status: The patient is alert and oriented x 3 at the time of the examination. The patient has apparent normal recent and remote memory, with reduced  attention span and concentration ability.   Speech is normal.  Cranial nerves: She has nystagmus on lateral gaze...  Facial symmetry is present. Facial strength and sensation is normal. Trapezius strength is normal..   No obvious hearing deficits are noted.  Motor:  Muscle bulk is normal.  Muscle tone is mildly increased in the legs.. Strength is 5/5 in the arms and left leg and 4+/5 in the right foot.  Sensory: She has reduced sensation to touch on the right side and reduced vibration sensation on the left left..     Coordination:   Finger-nose-finger and heel-to-shin is reduced,  worse on the right..  Rapid altering movements were performed symmetrically in the hands  Gait and station: Station is normal.  Gait is mildly wide and turn is wide.. Cannot tandem.  She has a mild right foot drop.  Romberg is negative.   Reflexes: Deep tendon reflexes are symmetric and increased with spread at the knees bilaterally   ASSESSMENT AND PLAN  1. Multiple sclerosis (HCC)   2. Vision changes   3. Ataxic gait   4. Other fatigue   5. Partial symptomatic epilepsy with complex partial seizures, intractable, without  status epilepticus (HCC)   6. Attention deficit disorder (ADD) in adult   7. Anxiety and depression   8. High risk medication use   9. Low vitamin D  level        1.   Continue Ocrevus.  IgG and IgM were fine last year but we will recheck..  Will also check vitamin D .   2.  Continue medications for ADD.     We discussed her mood issues.  She prefers not to see psychiatry agai.  Continue duloxetine . 3.   Due to the combination of her physical and cognitive impairments as well as her fatigue, she is disabled.  She would not be expected to improve to a point that she could return to work.   4.   Tamsulosin  for bladder.   5.   She will return to see in 6 months or sooner if new or worsening neurologic symptoms.   Taytum Scheck A. Vear, MD, PhD, DIEDRA 09/15/2023, 3:59 PM Certified in Neurology,  Clinical Neurophysiology, Sleep Medicine, Pain Medicine and Neuroimaging  [facility] [address] [place], [place] [address] [phone]

## 2023-09-16 ENCOUNTER — Ambulatory Visit: Payer: Self-pay | Admitting: Neurology

## 2023-09-16 ENCOUNTER — Other Ambulatory Visit: Payer: Self-pay | Admitting: Neurology

## 2023-09-16 ENCOUNTER — Encounter: Payer: Self-pay | Admitting: Neurology

## 2023-09-16 LAB — CBC WITH DIFFERENTIAL/PLATELET
Basophils Absolute: 0.1 x10E3/uL (ref 0.0–0.2)
Basos: 1 %
EOS (ABSOLUTE): 0.2 x10E3/uL (ref 0.0–0.4)
Eos: 2 %
Hematocrit: 47.8 % — ABNORMAL HIGH (ref 34.0–46.6)
Hemoglobin: 15.9 g/dL (ref 11.1–15.9)
Immature Grans (Abs): 0 x10E3/uL (ref 0.0–0.1)
Immature Granulocytes: 0 %
Lymphocytes Absolute: 2.4 x10E3/uL (ref 0.7–3.1)
Lymphs: 31 %
MCH: 30.9 pg (ref 26.6–33.0)
MCHC: 33.3 g/dL (ref 31.5–35.7)
MCV: 93 fL (ref 79–97)
Monocytes Absolute: 0.6 x10E3/uL (ref 0.1–0.9)
Monocytes: 7 %
Neutrophils Absolute: 4.7 x10E3/uL (ref 1.4–7.0)
Neutrophils: 59 %
Platelets: 303 x10E3/uL (ref 150–450)
RBC: 5.15 x10E6/uL (ref 3.77–5.28)
RDW: 12.7 % (ref 11.7–15.4)
WBC: 7.9 x10E3/uL (ref 3.4–10.8)

## 2023-09-16 LAB — IGG, IGA, IGM
IgA/Immunoglobulin A, Serum: 164 mg/dL (ref 87–352)
IgG (Immunoglobin G), Serum: 760 mg/dL (ref 586–1602)
IgM (Immunoglobulin M), Srm: 89 mg/dL (ref 26–217)

## 2023-09-16 LAB — VITAMIN D 25 HYDROXY (VIT D DEFICIENCY, FRACTURES): Vit D, 25-Hydroxy: 21.3 ng/mL — ABNORMAL LOW (ref 30.0–100.0)

## 2023-09-16 MED ORDER — VITAMIN D (ERGOCALCIFEROL) 1.25 MG (50000 UNIT) PO CAPS: 50000.0000 [IU] | ORAL_CAPSULE | ORAL | 3 refills | Status: AC

## 2023-09-18 ENCOUNTER — Encounter: Payer: Self-pay | Admitting: Neurology

## 2023-09-23 ENCOUNTER — Telehealth: Payer: Self-pay

## 2023-09-23 NOTE — Telephone Encounter (Signed)
 Faxed pt's OCREVUS Prescriber Order Form to [id number]

## 2023-09-28 ENCOUNTER — Encounter: Payer: Self-pay | Admitting: Neurology

## 2023-09-29 ENCOUNTER — Telehealth: Payer: Self-pay | Admitting: *Deleted

## 2023-09-29 MED ORDER — AMPHETAMINE-DEXTROAMPHETAMINE 30 MG PO TABS: 30.0000 mg | ORAL_TABLET | Freq: Two times a day (BID) | ORAL | 0 refills | Status: DC

## 2023-09-29 NOTE — Telephone Encounter (Signed)
 Last seen on 09/15/23 Follow up scheduled on 04/22/24   Dispensed Days Supply Quantity Provider Pharmacy  DEXTROAMP-AMPHETAMIN 30 MG TAB 08/20/2023 30 60 tablet Sater, Charlie LABOR, MD [facility] [phone] [place]...   Rx pending to be signed

## 2023-10-23 ENCOUNTER — Other Ambulatory Visit: Payer: Self-pay | Admitting: Neurology

## 2023-10-23 NOTE — Telephone Encounter (Signed)
 Last seen on 09/15/23 Follow up scheduled on 04/22/24

## 2023-10-29 ENCOUNTER — Encounter: Payer: Self-pay | Admitting: Neurology

## 2023-10-29 ENCOUNTER — Other Ambulatory Visit: Payer: Self-pay | Admitting: *Deleted

## 2023-10-29 ENCOUNTER — Other Ambulatory Visit: Payer: Self-pay

## 2023-10-29 MED ORDER — AMPHETAMINE-DEXTROAMPHETAMINE 30 MG PO TABS: 30.0000 mg | ORAL_TABLET | Freq: Two times a day (BID) | ORAL | 0 refills | Status: DC

## 2023-10-29 NOTE — Telephone Encounter (Signed)
 Last refilled 09/29/23 #60. Last seen 09/15/23 and next f/u 04/22/24.  KS,CMA has encounter open to send refill request to provider

## 2023-10-29 NOTE — Telephone Encounter (Deleted)
 Pt sent mychart asking for refill on Adderall 30mg . Last seen 09/15/23 and next f/u 04/22/24.

## 2023-10-30 ENCOUNTER — Other Ambulatory Visit: Payer: Self-pay | Admitting: Neurology

## 2023-10-30 MED ORDER — AMPHETAMINE-DEXTROAMPHETAMINE 30 MG PO TABS: 30.0000 mg | ORAL_TABLET | Freq: Two times a day (BID) | ORAL | 0 refills | Status: DC

## 2023-10-30 NOTE — Addendum Note (Signed)
 Addended by: ONEITA HOIST E on: 10/30/2023 12:58 PM   Modules accepted: Orders

## 2023-10-30 NOTE — Telephone Encounter (Signed)
 Please resend to [facility] per pt request

## 2023-10-30 NOTE — Addendum Note (Signed)
 Addended by: ONEITA HOIST E on: 10/30/2023 10:21 AM   Modules accepted: Orders

## 2023-11-03 ENCOUNTER — Encounter: Payer: Self-pay | Admitting: Neurology

## 2023-11-03 ENCOUNTER — Other Ambulatory Visit: Payer: Self-pay | Admitting: Neurology

## 2023-11-03 MED ORDER — LORAZEPAM 1 MG PO TABS: ORAL_TABLET | ORAL | 1 refills | Status: AC

## 2023-12-02 ENCOUNTER — Other Ambulatory Visit: Payer: Self-pay | Admitting: *Deleted

## 2023-12-02 ENCOUNTER — Encounter: Payer: Self-pay | Admitting: Neurology

## 2023-12-02 MED ORDER — AMPHETAMINE-DEXTROAMPHETAMINE 30 MG PO TABS: 30.0000 mg | ORAL_TABLET | Freq: Two times a day (BID) | ORAL | 0 refills | Status: DC

## 2023-12-02 NOTE — Telephone Encounter (Signed)
 Last seen 09/15/23 and next f/u 04/22/24. Last refilled 10/30/23 #60.

## 2024-01-03 ENCOUNTER — Encounter: Payer: Self-pay | Admitting: Neurology

## 2024-01-05 MED ORDER — AMPHETAMINE-DEXTROAMPHETAMINE 30 MG PO TABS: 30.0000 mg | ORAL_TABLET | Freq: Two times a day (BID) | ORAL | 0 refills | Status: DC

## 2024-01-05 NOTE — Telephone Encounter (Signed)
 Last seen on 09/15/23 Follow up scheduled on 04/22/24    Dispensed Days Supply Quantity Provider Pharmacy  DEXTROAMP-AMPHETAMIN 30 MG TAB 12/02/2023 30 60 tablet Sater, Charlie LABOR, MD [facility] [phone] [place]...     Rx pending to be signed

## 2024-01-09 ENCOUNTER — Encounter: Payer: Self-pay | Admitting: Neurology

## 2024-01-27 ENCOUNTER — Other Ambulatory Visit: Payer: Self-pay | Admitting: Neurology

## 2024-01-27 ENCOUNTER — Other Ambulatory Visit: Payer: Self-pay | Admitting: *Deleted

## 2024-01-27 DIAGNOSIS — R399 Unspecified symptoms and signs involving the genitourinary system: Secondary | ICD-10-CM

## 2024-01-27 DIAGNOSIS — N390 Urinary tract infection, site not specified: Secondary | ICD-10-CM

## 2024-01-27 MED ORDER — NITROFURANTOIN MONOHYD MACRO 100 MG PO CAPS: 100.0000 mg | ORAL_CAPSULE | Freq: Two times a day (BID) | ORAL | 0 refills | Status: AC

## 2024-01-27 NOTE — Telephone Encounter (Signed)
 From Dr Vear:  nitrofurantoin  100 mg po bid #14 and have her stop by [facility] for UA. UCx

## 2024-01-27 NOTE — Addendum Note (Signed)
 Addended by: HILLIARD HEATHER CROME on: 01/27/2024 02:15 PM   Modules accepted: Orders

## 2024-01-27 NOTE — Progress Notes (Signed)
 Dr Vear aware pt wants to go to [facility] instead of [facility]. Order placed for [facility]. UA & Urine Cx. Orders signed by Dr Vear then faxed to [facility] fax # [phone]. Received a receipt of confirmation.

## 2024-01-28 ENCOUNTER — Ambulatory Visit: Payer: Self-pay | Admitting: Neurology

## 2024-01-29 LAB — URINE CULTURE
MICRO NUMBER:: 17332632
SPECIMEN QUALITY:: ADEQUATE

## 2024-01-29 LAB — URINALYSIS
Glucose, UA: NEGATIVE
Hgb urine dipstick: NEGATIVE
Ketones, ur: NEGATIVE
Nitrite: POSITIVE — AB
Specific Gravity, Urine: 1.021 (ref 1.001–1.035)
pH: <=5 — AB (ref 5.0–8.0)

## 2024-02-04 ENCOUNTER — Encounter: Payer: Self-pay | Admitting: Neurology

## 2024-02-05 ENCOUNTER — Encounter: Payer: Self-pay | Admitting: Neurology

## 2024-02-05 ENCOUNTER — Other Ambulatory Visit: Payer: Self-pay

## 2024-02-05 ENCOUNTER — Other Ambulatory Visit: Payer: Self-pay | Admitting: Neurology

## 2024-02-05 MED ORDER — SULFAMETHOXAZOLE-TRIMETHOPRIM 800-160 MG PO TABS: 1.0000 | ORAL_TABLET | Freq: Two times a day (BID) | ORAL | 0 refills | Status: AC

## 2024-02-05 MED ORDER — AMPHETAMINE-DEXTROAMPHETAMINE 30 MG PO TABS: 30.0000 mg | ORAL_TABLET | Freq: Two times a day (BID) | ORAL | 0 refills | Status: DC

## 2024-02-20 ENCOUNTER — Encounter: Payer: Self-pay | Admitting: Neurology

## 2024-02-28 ENCOUNTER — Encounter: Payer: Self-pay | Admitting: Neurology

## 2024-03-01 MED ORDER — ONDANSETRON HCL 8 MG PO TABS: ORAL_TABLET | ORAL | 3 refills | Status: AC

## 2024-03-01 NOTE — Addendum Note (Signed)
 Addended by: ONEITA NEVELYN BRAVO on: 03/01/2024 10:18 AM   Modules accepted: Orders

## 2024-03-10 ENCOUNTER — Encounter: Payer: Self-pay | Admitting: Neurology

## 2024-03-10 MED ORDER — AMPHETAMINE-DEXTROAMPHETAMINE 30 MG PO TABS: 30.0000 mg | ORAL_TABLET | Freq: Two times a day (BID) | ORAL | 0 refills | Status: AC

## 2024-03-10 NOTE — Telephone Encounter (Signed)
 Requested Prescriptions   Pending Prescriptions Disp Refills   amphetamine -dextroamphetamine  (ADDERALL) 30 MG tablet 60 tablet 0    Sig: Take 1 tablet by mouth 2 (two) times daily.   Last seen 09/15/23 Next appt 04/22/24 Dispenses   Dispensed Days Supply Quantity Provider Pharmacy  DEXTROAMP-AMPHETAMIN 30 MG TAB 02/05/2024 30 60 tablet Sater, Charlie LABOR, MD [facility] [phone] [place]...  DEXTROAMP-AMPHETAMIN 30 MG TAB 01/05/2024 30 60 tablet Sater, Charlie LABOR, MD [facility] [phone] [place]...  DEXTROAMP-AMPHETAMIN 30 MG TAB 12/02/2023 30 60 tablet Sater, Charlie LABOR, MD [facility] [phone] [place]...  DEXTROAMP-AMPHETAMIN 30 MG TAB 10/30/2023 30 60 tablet Sater, Charlie LABOR, MD [facility] [phone] [place]...  DEXTROAMP-AMPHETAMIN 30 MG TAB 09/29/2023 30 60 tablet Sater, Charlie LABOR, MD [facility] [phone] [place]...  DEXTROAMP-AMPHETAMIN 30 MG TAB 08/20/2023 30 60 tablet Sater, Charlie LABOR, MD [facility] [phone] [place]...  DEXTROAMP-AMPHETAMIN 30 MG TAB 07/21/2023 30 60 tablet Sater, Charlie LABOR, MD [facility] [phone] [place]...  DEXTROAMP-AMPHETAMIN 30 MG TAB 06/20/2023 30 60 tablet Ines Onetha NOVAK, MD [facility] [phone] [place]...  DEXTROAMP-AMPHETAMIN 30 MG TAB 05/20/2023 30 60 tablet Sater, Charlie LABOR, MD [facility] [phone] [place]...  DEXTROAMP-AMPHETAMIN 30 MG TAB 04/15/2023 30 60 tablet Sater, Charlie LABOR, MD [facility] [phone] [place]..SABRA

## 2024-03-22 ENCOUNTER — Encounter: Payer: Self-pay | Admitting: Neurology

## 2024-04-22 ENCOUNTER — Telehealth: Admitting: Neurology
